# Patient Record
Sex: Female | Born: 1935 | Race: White | Hispanic: No | Marital: Married | Smoking: Former smoker
Health system: Southern US, Community
[De-identification: ages and names within clinical notes are randomized; demographics above are authoritative.]

## PROBLEM LIST (undated history)

## (undated) DIAGNOSIS — M545 Low back pain, unspecified: Secondary | ICD-10-CM

## (undated) DIAGNOSIS — I10 Essential (primary) hypertension: Secondary | ICD-10-CM

## (undated) DIAGNOSIS — I4892 Unspecified atrial flutter: Secondary | ICD-10-CM

## (undated) DIAGNOSIS — G4700 Insomnia, unspecified: Secondary | ICD-10-CM

## (undated) HISTORY — PX: APPENDECTOMY: SHX54

## (undated) HISTORY — PX: ABDOMINAL HYSTERECTOMY: SHX81

## (undated) HISTORY — PX: PARTIAL COLECTOMY: SHX5273

---

## 2004-12-18 ENCOUNTER — Ambulatory Visit: Payer: Self-pay | Admitting: Unknown Physician Specialty

## 2005-04-07 ENCOUNTER — Ambulatory Visit: Payer: Self-pay | Admitting: Unknown Physician Specialty

## 2005-04-08 ENCOUNTER — Ambulatory Visit: Payer: Self-pay | Admitting: Unknown Physician Specialty

## 2005-06-30 ENCOUNTER — Ambulatory Visit: Payer: Self-pay | Admitting: Urology

## 2006-04-05 ENCOUNTER — Ambulatory Visit: Payer: Self-pay | Admitting: Unknown Physician Specialty

## 2006-07-07 ENCOUNTER — Ambulatory Visit: Payer: Self-pay | Admitting: Urology

## 2007-07-13 ENCOUNTER — Ambulatory Visit: Payer: Self-pay | Admitting: Unknown Physician Specialty

## 2007-08-08 ENCOUNTER — Ambulatory Visit: Payer: Self-pay | Admitting: Unknown Physician Specialty

## 2007-08-25 ENCOUNTER — Other Ambulatory Visit: Payer: Self-pay

## 2007-08-25 ENCOUNTER — Ambulatory Visit: Payer: Self-pay | Admitting: Surgery

## 2007-08-28 ENCOUNTER — Inpatient Hospital Stay: Payer: Self-pay | Admitting: Surgery

## 2016-11-01 ENCOUNTER — Observation Stay: Payer: Medicare Other

## 2016-11-01 ENCOUNTER — Emergency Department: Payer: Medicare Other

## 2016-11-01 ENCOUNTER — Observation Stay
Admission: EM | Admit: 2016-11-01 | Discharge: 2016-11-01 | Disposition: A | Discharge status: Home / Self Care | Payer: Medicare Other | Attending: Internal Medicine | Admitting: Internal Medicine

## 2016-11-01 DIAGNOSIS — G4700 Insomnia, unspecified: Secondary | ICD-10-CM | POA: Diagnosis not present

## 2016-11-01 DIAGNOSIS — M545 Low back pain: Secondary | ICD-10-CM | POA: Diagnosis not present

## 2016-11-01 DIAGNOSIS — I484 Atypical atrial flutter: Secondary | ICD-10-CM | POA: Insufficient documentation

## 2016-11-01 DIAGNOSIS — Z791 Long term (current) use of non-steroidal anti-inflammatories (NSAID): Secondary | ICD-10-CM | POA: Diagnosis not present

## 2016-11-01 DIAGNOSIS — Z79899 Other long term (current) drug therapy: Secondary | ICD-10-CM | POA: Diagnosis not present

## 2016-11-01 DIAGNOSIS — Z87891 Personal history of nicotine dependence: Secondary | ICD-10-CM | POA: Insufficient documentation

## 2016-11-01 DIAGNOSIS — I4581 Long QT syndrome: Secondary | ICD-10-CM | POA: Insufficient documentation

## 2016-11-01 DIAGNOSIS — Z9049 Acquired absence of other specified parts of digestive tract: Secondary | ICD-10-CM | POA: Insufficient documentation

## 2016-11-01 DIAGNOSIS — R0600 Dyspnea, unspecified: Principal | ICD-10-CM | POA: Diagnosis present

## 2016-11-01 DIAGNOSIS — R531 Weakness: Secondary | ICD-10-CM | POA: Diagnosis not present

## 2016-11-01 DIAGNOSIS — I10 Essential (primary) hypertension: Secondary | ICD-10-CM | POA: Insufficient documentation

## 2016-11-01 DIAGNOSIS — G459 Transient cerebral ischemic attack, unspecified: Secondary | ICD-10-CM | POA: Diagnosis not present

## 2016-11-01 DIAGNOSIS — K5900 Constipation, unspecified: Secondary | ICD-10-CM | POA: Diagnosis not present

## 2016-11-01 DIAGNOSIS — N179 Acute kidney failure, unspecified: Secondary | ICD-10-CM | POA: Insufficient documentation

## 2016-11-01 HISTORY — DX: Low back pain: M54.5

## 2016-11-01 HISTORY — DX: Essential (primary) hypertension: I10

## 2016-11-01 HISTORY — DX: Low back pain, unspecified: M54.50

## 2016-11-01 HISTORY — DX: Insomnia, unspecified: G47.00

## 2016-11-01 HISTORY — DX: Unspecified atrial flutter: I48.92

## 2016-11-01 LAB — CBC
HEMATOCRIT: 44.4 % (ref 35.0–47.0)
Hemoglobin: 15.4 g/dL (ref 12.0–16.0)
MCH: 30.7 pg (ref 26.0–34.0)
MCHC: 34.7 g/dL (ref 32.0–36.0)
MCV: 88.4 fL (ref 80.0–100.0)
Platelets: 194 10*3/uL (ref 150–440)
RBC: 5.02 MIL/uL (ref 3.80–5.20)
RDW: 12.9 % (ref 11.5–14.5)
WBC: 8 10*3/uL (ref 3.6–11.0)

## 2016-11-01 LAB — BASIC METABOLIC PANEL
Anion gap: 11 (ref 5–15)
BUN: 23 mg/dL — AB (ref 6–20)
CHLORIDE: 102 mmol/L (ref 101–111)
CO2: 24 mmol/L (ref 22–32)
Calcium: 9.8 mg/dL (ref 8.9–10.3)
Creatinine, Ser: 1.25 mg/dL — ABNORMAL HIGH (ref 0.44–1.00)
GFR calc non Af Amer: 37 mL/min — ABNORMAL LOW (ref >60)
GFR, EST AFRICAN AMERICAN: 43 mL/min — AB (ref >60)
Glucose, Bld: 119 mg/dL — ABNORMAL HIGH (ref 65–99)
POTASSIUM: 3.6 mmol/L (ref 3.5–5.1)
SODIUM: 137 mmol/L (ref 135–145)

## 2016-11-01 LAB — TROPONIN I
Troponin I: <0.03 ng/mL (ref <0.03)
Troponin I: <0.03 ng/mL (ref <0.03)
Troponin I: <0.03 ng/mL (ref <0.03)
Troponin I: <0.03 ng/mL (ref <0.03)

## 2016-11-01 LAB — HEMOGLOBIN A1C
Hgb A1c MFr Bld: 5.5 % (ref 4.8–5.6)
Mean Plasma Glucose: 111.15 mg/dL

## 2016-11-01 LAB — TSH: TSH: 1.997 u[IU]/mL (ref 0.350–4.500)

## 2016-11-01 MED ORDER — AMIODARONE HCL 200 MG PO TABS: 200.0000 mg | ORAL_TABLET | Freq: Two times a day (BID) | ORAL | 2 refills | Status: DC

## 2016-11-01 MED ORDER — CARVEDILOL 6.25 MG PO TABS: 6.2500 mg | ORAL_TABLET | Freq: Two times a day (BID) | ORAL | Status: DC

## 2016-11-01 MED ORDER — ASPIRIN EC 81 MG PO TBEC
81.0000 mg | DELAYED_RELEASE_TABLET | Freq: Every day | ORAL | Status: DC
Start: 2016-11-01 — End: 2016-11-01

## 2016-11-01 MED ORDER — CARVEDILOL 3.125 MG PO TABS
3.1250 mg | ORAL_TABLET | Freq: Two times a day (BID) | ORAL | Status: DC
  Administered 2016-11-01: 3.125 mg via ORAL
  Filled 2016-11-01: qty 1

## 2016-11-01 MED ORDER — ACETAMINOPHEN 650 MG RE SUPP: 650.0000 mg | Freq: Four times a day (QID) | RECTAL | Status: DC | PRN

## 2016-11-01 MED ORDER — DOCUSATE SODIUM 100 MG PO CAPS
100.0000 mg | ORAL_CAPSULE | Freq: Two times a day (BID) | ORAL | Status: DC
  Administered 2016-11-01: 100 mg via ORAL
  Filled 2016-11-01: qty 1

## 2016-11-01 MED ORDER — ZOLPIDEM TARTRATE 5 MG PO TABS: 5.0000 mg | ORAL_TABLET | Freq: Every day | ORAL | Status: DC

## 2016-11-01 MED ORDER — STROKE: EARLY STAGES OF RECOVERY BOOK: Freq: Once | Status: DC

## 2016-11-01 MED ORDER — ONDANSETRON HCL 4 MG/2ML IJ SOLN: 4.0000 mg | Freq: Four times a day (QID) | INTRAMUSCULAR | Status: DC | PRN

## 2016-11-01 MED ORDER — ACETAMINOPHEN 325 MG PO TABS: 650.0000 mg | ORAL_TABLET | Freq: Four times a day (QID) | ORAL | Status: DC | PRN

## 2016-11-01 MED ORDER — AMIODARONE HCL 200 MG PO TABS
400.0000 mg | ORAL_TABLET | Freq: Two times a day (BID) | ORAL | Status: DC
  Administered 2016-11-01: 400 mg via ORAL
  Filled 2016-11-01: qty 2

## 2016-11-01 MED ORDER — LORAZEPAM 1 MG PO TABS
1.0000 mg | ORAL_TABLET | Freq: Once | ORAL | Status: AC
  Administered 2016-11-01: 1 mg via ORAL
  Filled 2016-11-01: qty 1

## 2016-11-01 MED ORDER — POLYETHYLENE GLYCOL 3350 17 G PO PACK
17.0000 g | PACK | Freq: Every day | ORAL | Status: DC
  Administered 2016-11-01: 17 g via ORAL
  Filled 2016-11-01: qty 1

## 2016-11-01 MED ORDER — PANTOPRAZOLE SODIUM 40 MG PO TBEC
40.0000 mg | DELAYED_RELEASE_TABLET | Freq: Every day | ORAL | Status: DC
  Administered 2016-11-01: 40 mg via ORAL
  Filled 2016-11-01: qty 1

## 2016-11-01 MED ORDER — DOCUSATE SODIUM 100 MG PO CAPS: 100.0000 mg | ORAL_CAPSULE | Freq: Two times a day (BID) | ORAL | 0 refills | Status: DC

## 2016-11-01 MED ORDER — CARVEDILOL 6.25 MG PO TABS: 6.2500 mg | ORAL_TABLET | Freq: Two times a day (BID) | ORAL | 2 refills | Status: DC

## 2016-11-01 MED ORDER — ASPIRIN EC 81 MG PO TBEC
81.0000 mg | DELAYED_RELEASE_TABLET | Freq: Every day | ORAL | Status: DC
  Filled 2016-11-01: qty 1

## 2016-11-01 MED ORDER — HEPARIN SODIUM (PORCINE) 5000 UNIT/ML IJ SOLN: 5000.0000 [IU] | Freq: Three times a day (TID) | INTRAMUSCULAR | Status: DC

## 2016-11-01 MED ORDER — IOPAMIDOL (ISOVUE-370) INJECTION 76%
60.0000 mL | Freq: Once | INTRAVENOUS | Status: AC | PRN
  Administered 2016-11-01: 60 mL via INTRAVENOUS

## 2016-11-01 MED ORDER — PANTOPRAZOLE SODIUM 40 MG PO TBEC: 40.0000 mg | DELAYED_RELEASE_TABLET | Freq: Every day | ORAL | 2 refills | Status: DC

## 2016-11-01 MED ORDER — APIXABAN 5 MG PO TABS
5.0000 mg | ORAL_TABLET | Freq: Two times a day (BID) | ORAL | Status: DC
  Administered 2016-11-01: 5 mg via ORAL
  Filled 2016-11-01: qty 1

## 2016-11-01 MED ORDER — ONDANSETRON HCL 4 MG PO TABS: 4.0000 mg | ORAL_TABLET | Freq: Four times a day (QID) | ORAL | Status: DC | PRN

## 2016-11-01 MED ORDER — TRAMADOL HCL 50 MG PO TABS: 25.0000 mg | ORAL_TABLET | Freq: Every day | ORAL | Status: DC

## 2016-11-01 MED ORDER — BISACODYL 5 MG PO TBEC
10.0000 mg | DELAYED_RELEASE_TABLET | Freq: Once | ORAL | Status: AC
  Administered 2016-11-01: 10 mg via ORAL
  Filled 2016-11-01: qty 2

## 2016-11-01 MED ORDER — AMIODARONE HCL 200 MG PO TABS: 200.0000 mg | ORAL_TABLET | Freq: Two times a day (BID) | ORAL | Status: DC

## 2016-11-01 MED ORDER — INFLUENZA VAC SPLIT HIGH-DOSE 0.5 ML IM SUSY: 0.5000 mL | PREFILLED_SYRINGE | INTRAMUSCULAR | Status: DC

## 2016-11-01 NOTE — Evaluation (Signed)
Physical Therapy Evaluation Patient Details Name: Lynn Hernandez MRN: [id number] DOB: Mar 13, 1926 Today's Date: 11/01/2016   History of Present Illness  Pt is an 83 y.o. female presenting to hospital with L UE weakness and chest discomfort.  Pt with recent hospital admission at [facility] for palpitations and SOB (dx a-fib and started on Eliquis).  Pt admitted for TIA, arrhythmia, and AKI.  CT of head and MRI negative for acute abnormality.  PMH includes a-fib, htn, partial colectomy.  Clinical Impression  Pt was WNL for ROM, MMT and sensation on UE and LE bilaterally. She was able to perform all bed mobility, transfer and balance tasks independently; pt was able to ambulate >220ft and climb at least 6 steps with CGA provided for safety, but not required for session. Pt's HR remained in the low 90's (bpm) for seated tasks and gait; increased to 110 bpm after walking up and down stairs. Pt did not report any complaints of SOB throughout session. No further acute PT needs identified. Will complete current PT order and discharge pt from PT in house.    Follow Up Recommendations No PT follow up    Equipment Recommendations  None recommended by PT    Recommendations for Other Services       Precautions / Restrictions Precautions Precautions: None Restrictions Weight Bearing Restrictions: No      Mobility  Bed Mobility Overal bed mobility: Independent             General bed mobility comments: Steady, no vc's required  Transfers Overall transfer level: Independent Equipment used: None             General transfer comment: Safely performed, no vc's required  Ambulation/Gait Ambulation/Gait assistance: Independent (CGA provided for safety, but not required for session) Ambulation Distance (Feet): 235 Feet Assistive device: None Gait Pattern/deviations: WFL(Within Functional Limits) Gait velocity: 5.2 ft/sec Gait velocity interpretation: at or above normal speed for  age/gender General Gait Details: Steady, reciprocal gait pattern, no loss of balance, slight toe out bilaterally  Stairs Stairs: Yes Stairs assistance: Independent (CGA provided for safety but not required for session) Stair Management: One rail Right;One rail Left;Alternating pattern (Rail on right going up, rail on left going down) Number of Stairs: 6    Wheelchair Mobility    Modified Rankin (Stroke Patients Only)       Balance Overall balance assessment: Independent (Able to reach outside of BOS while standing without loss of balance)                                           Pertinent Vitals/Pain Pain Assessment: No/denies pain    Home Living Family/patient expects to be discharged to:: Private residence Living Arrangements: Alone Available Help at Discharge: Family Type of Home: House Home Access: Stairs to enter Entrance Stairs-Rails: None (pulls on door frame/handle) Entrance Stairs-Number of Steps: 1 Home Layout: Multi-level (Two levels, but stays on first floor) Home Equipment: Grab bars - toilet;Grab bars - tub/shower      Prior Function Level of Independence: Independent         Comments: Pt is independent with all ADLs including driving on her tractor to mow the lawn and driving her car to run errands     Hand Dominance   Dominant Hand: Right    Extremity/Trunk Assessment   Upper Extremity Assessment Upper Extremity Assessment: Overall  WFL for tasks assessed    Lower Extremity Assessment Lower Extremity Assessment: Overall WFL for tasks assessed    Cervical / Trunk Assessment Cervical / Trunk Assessment: Normal  Communication   Communication: HOH (Bilateral hearing aids)  Cognition Arousal/Alertness: Awake/alert Behavior During Therapy: WFL for tasks assessed/performed Overall Cognitive Status: Within Functional Limits for tasks assessed                                        General Comments  Pt  agreed to PT.    Exercises     Assessment/Plan    PT Assessment Patent does not need any further PT services  PT Problem List         PT Treatment Interventions      PT Goals (Current goals can be found in the Care Plan section)  Acute Rehab PT Goals Patient Stated Goal: to be able to go home PT Goal Formulation: With patient Time For Goal Achievement: 11/15/16 Potential to Achieve Goals: Good    Frequency     Barriers to discharge        Co-evaluation               AM-PAC PT "6 Clicks" Daily Activity  Outcome Measure Difficulty turning over in bed (including adjusting bedclothes, sheets and blankets)?: None Difficulty moving from lying on back to sitting on the side of the bed? : None Difficulty sitting down on and standing up from a chair with arms (e.g., wheelchair, bedside commode, etc,.)?: None Help needed moving to and from a bed to chair (including a wheelchair)?: None Help needed walking in hospital room?: None Help needed climbing 3-5 steps with a railing? : None 6 Click Score: 24    End of Session Equipment Utilized During Treatment: Gait belt Activity Tolerance: Patient tolerated treatment well Patient left: in chair;with call bell/phone within reach (Nursing cleared pt not to have chair alarm and to leave SCD's off.) Nurse Communication: Mobility status (Pt's HR during session) PT Visit Diagnosis: Muscle weakness (generalized) (M62.81)    Time: 1610-9604 PT Time Calculation (min) (ACUTE ONLY): 32 min   Charges:         PT G CodesSarina Ser, SPT 11/01/2016, 4:23 PM

## 2016-11-01 NOTE — Care Management (Signed)
No discharge needs identified by members of the care team.  Eliquis and amiodarone are not new medications for this admission.

## 2016-11-01 NOTE — Progress Notes (Signed)
SLP Cancellation Note  Patient Details Name: Lynn Hernandez MRN: [id number] DOB: 03/16/1926   Cancelled treatment:       Reason Eval/Treat Not Completed: SLP screened, no needs identified, will sign off (chart reviewed; consulted NSG then met w/ pt/family) Pt denied any difficulty swallowing and is currently on a regular diet; she stated she did have trouble swallowing "chicken and large pills" in the past 3-4 days during her admission to the hospital in [place]. Briefly discussed general s/s of reflux and foods that can be difficult to swallow; strategies to aid including using chewable vitamins(which she now has she stated). Encouraged pt to f/u w/ her primary MD re: her s/s and possible f/u w/ GI to r/o reflux issues. She has been swallowing pills w/ applesauce per NSG. Pt conversed at conversational level w/out deficits noted; pt and family denied any speech-language deficits.  No further skilled ST services indicated as pt appears at her baseline. Pt agreed. NSG to reconsult if any change in status.    Katherine Watson, MS, CCC-SLP Watson,Katherine 11/01/2016, 3:25 PM   

## 2016-11-01 NOTE — Consult Note (Signed)
Cardiology Consult    Patient ID: Lynn Hernandez MRN: [id number], DOB/AGE: 13-Feb-1926   Admit date: 11/01/2016 Date of Consult: 11/01/2016  Primary Physician: Dortha Kern, MD Primary Cardiologist: new - M. Kirke Corin, MD  Requesting Provider: Henreitta Leber, MD  Patient Profile    Lynn Hernandez is a 83 y.o. female with a history of insomnia and low back pain, who is being seen today for the evaluation of Atrial flutter at the request of Dr. Nemiah Commander.  Past Medical History   Past Medical History:  Diagnosis Date  . Atrial flutter (HCC)    a. Dx 10/29/2016 - Despina Hick [facility], [place]-->Placed on Eliquis 5 bid and amio.  [name] HTN (hypertension)   . Insomnia   . Low back pain     Past Surgical History:  Procedure Laterality Date  . ABDOMINAL HYSTERECTOMY    . APPENDECTOMY    . PARTIAL COLECTOMY       Allergies  No Known Allergies  History of Present Illness    83 year old female without prior cardiac history. She was in her usual state of health until the afternoon of September 21, when while staying with family at [facility], she had sudden onset of tachycardia palpitations associated with dyspnea and mild chest discomfort. This began at 2 PM. She was [facility] and was diagnosed with atrial flutter. An echocardiogram was reportedly performed, however she is not aware of the results we do not currently have access to the results. She was placed on amiodarone and eliquis and discharged home on September 23 with recommendation to follow-up with cardiology.  In the early morning hours of September she returned home at approximate 5 PM yesterday, September 23, and felt well throughout the evening. She went to bed and at some point awoke suddenly with a cold sensation in her neck that spread up her face and head. She noted dryness of her mouth and difficulty swallowing. She got up and walked around some thinking of this would improve. She then developed left arm  and leg heaviness. She called EMS and was taken to [facility]. In route, she noted resolution of all symptoms. She thinks that symptoms lasted about 30-40 minutes in total. Upon arrival to the emergency department, she was found to be in atrial flutter at a rate of 98. Head CT was nonacute. CT Marylene Land of the chest was also performed and did not show any evidence of PE. Bilateral upper lobe nodules were noted. She was admitted for further evaluation is currently waiting MRI/MRA. We have been asked to evaluate in the setting of atrial flutter. She is currently asymptomatic from that standpoint.  Inpatient Medications    .  stroke: mapping our early stages of recovery book   Does not apply Once  . amiodarone  400 mg Oral BID  . apixaban  5 mg Oral BID  . aspirin EC  81 mg Oral Daily  . carvedilol  3.125 mg Oral BID  . docusate sodium  100 mg Oral BID  . [START ON 11/02/2016] Influenza vac split quadrivalent PF  0.5 mL Intramuscular Tomorrow-1000  . traMADol  25 mg Oral QHS  . zolpidem  5 mg Oral QHS    Family History    Family History  Problem Relation Age of Onset  . Healthy Mother        Died of old age @ 59.  [name] Healthy Maternal Grandmother   . Prostate cancer Father   .  Prostate cancer Brother   . Healthy Brother     Social History    Social History   Social History  . Marital status: Married    Spouse name: N/A  . Number of children: N/A  . Years of education: N/A   Occupational History  . 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 Smoking status: Former Games developer  . Smokeless tobacco: Never Used     Comment: smoked 1 pack/wk for about 10 years in her 50's to 51's.  . Alcohol use No  . Drug use: No  . Sexual activity: Not on file   Other Topics Concern  . Not on file   Social History Narrative   Lives in [place].  Active.  Does not routinely exercise.     Review of Systems    General:  No chills, fever, night sweats or weight  changes.  Cardiovascular:  +++ chest pain in setting of rapid aflutter 9/21 - assoc w/ dyspnea.  No edema, orthopnea, palpitations, paroxysmal nocturnal dyspnea. Dermatological: No rash, lesions/masses Respiratory: No cough, dyspnea Urologic: No hematuria, dysuria Abdominal:   No nausea, vomiting, diarrhea, bright red blood per rectum, melena, or hematemesis Neurologic:  Left arm/leg heaviness yesterday. Cold sensation of neck/face - resolved.  No visual changes, wkns, changes in mental status. All other systems reviewed and are otherwise negative except as noted above.  Physical Exam    Blood pressure 139/81, pulse 98, temperature 98.2 F (36.8 C), temperature source Oral, resp. rate 18, height  (1.626 m), weight 157 lb 6.4 oz (71.4 kg), SpO2 97 %.  General: Pleasant, NAD Psych: Normal affect. Neuro: Alert and oriented X 3. Moves all extremities spontaneously. HEENT: Normal  Neck: Supple without bruits or JVD. Lungs:  Resp regular and unlabored, CTA. Heart: IR, IR no s3, s4, or murmurs. Abdomen: Soft, non-tender, non-distended, BS + x 4.  Extremities: No clubbing, cyanosis or edema. DP/PT/Radials 2+ and equal bilaterally.  Labs     Recent Labs  11/01/16 0105 11/01/16 0502 11/01/16 0945  TROPONINI <0.03 <0.03 <0.03   Lab Results  Component Value Date   WBC 8.0 11/01/2016   HGB 15.4 11/01/2016   HCT 44.4 11/01/2016   MCV 88.4 11/01/2016   PLT 194 11/01/2016     Recent Labs Lab 11/01/16 0105  NA 137  K 3.6  CL 102  CO2 24  BUN 23*  CREATININE 1.25*  CALCIUM 9.8  GLUCOSE 119*     Radiology Studies    Ct Head Wo Contrast  Result Date: 11/01/2016 CLINICAL DATA:  Weakness EXAM: CT HEAD WITHOUT CONTRAST TECHNIQUE: Contiguous axial images were obtained from the base of the skull through the vertex without intravenous contrast. COMPARISON:  None. FINDINGS: Brain: No mass lesion, intraparenchymal hemorrhage or extra-axial collection. No evidence of acute cortical  infarct. There is periventricular hypoattenuation compatible with chronic microvascular disease. Vascular: No hyperdense vessel or unexpected calcification. Skull: Normal visualized skull base, calvarium and extracranial soft tissues. Sinuses/Orbits: No sinus fluid levels or advanced mucosal thickening. No mastoid effusion. Normal orbits. IMPRESSION: Mild chronic microvascular ischemia for age.  No acute abnormality. Electronically Signed   By: Deatra Robinson M.D.   On: 11/01/2016 02:37   Ct Angio Chest Pe W And/or Wo Contrast  Result Date: 11/01/2016 CLINICAL DATA:  Chest pain and weakness EXAM: CT ANGIOGRAPHY CHEST WITH CONTRAST TECHNIQUE: Multidetector CT imaging of the chest was performed using the standard protocol during  bolus administration of intravenous contrast. Multiplanar CT image reconstructions and MIPs were obtained to evaluate the vascular anatomy. CONTRAST:  60 mL Isovue 370 COMPARISON:  Chest radiograph 11/01/2016 FINDINGS: Cardiovascular: Contrast injection is sufficient to demonstrate satisfactory opacification of the pulmonary arteries to the segmental level. There is no pulmonary embolus. The main pulmonary artery is within normal limits for size. There is no CT evidence of acute right heart strain. There is calcific aortic atherosclerosis. There is a normal 3-vessel arch branching pattern. Heart size is mildly enlarged. No pericardial effusion. Mediastinum/Nodes: No mediastinal, hilar or axillary lymphadenopathy. The visualized thyroid and thoracic esophageal course are unremarkable. Lungs/Pleura: 4 mm nodule in the right upper lobe. No pleural effusion or pneumothorax. Bibasilar atelectasis. No focal consolidation. There are multiple perifissural nodules of the left lung measuring up to 3 mm. Focal area of atelectasis in the left lower lobe. Upper Abdomen: Contrast bolus timing is not optimized for evaluation of the abdominal organs. Within this limitation, the visualized organs of the  upper abdomen are normal. Musculoskeletal: No chest wall abnormality. No acute or significant osseous findings. Review of the MIP images confirms the above findings. IMPRESSION: 1. No pulmonary embolus or other acute thoracic abnormality. 2. 4 mm right upper lobe nodule. 3 mm left upper lobe nodules. No follow-up needed if patient is low-risk (and has no known or suspected primary neoplasm). Non-contrast chest CT can be considered in 12 months if patient is high-risk. This recommendation follows the consensus statement: Guidelines for Management of Incidental Pulmonary Nodules Detected on CT Images: From the Fleischner Society 2017; Radiology 2017; 284:228-243. 3. Mild cardiomegaly and Aortic Atherosclerosis (ICD10-I70.0). Electronically Signed   By: Deatra Robinson M.D.   On: 11/01/2016 02:26   Dg Chest Port 1 View  Result Date: 11/01/2016 CLINICAL DATA:  Left-sided chest pain and weakness.  Dyspnea. EXAM: PORTABLE CHEST 1 VIEW COMPARISON:  None. FINDINGS: The heart size and mediastinal contours are within normal limits. Aortic atherosclerosis is noted. Mitral annular calcifications are seen. Mild upper lobe emphysematous hyperinflation. No pneumonic consolidation, pneumothorax, pulmonary edema nor effusion. Osteoarthritis of the [facility] and glenohumeral joints. Soft tissue calcifications are noted along the right rotator cuff. IMPRESSION: 1. Upper lobe predominant emphysematous hyperinflation of the lungs. 2. Aortic atherosclerosis. 3. Right rotator cuff calcific tendinopathy with AC glenohumeral joint osteoarthritis bilaterally. Electronically Signed   By: Tollie Eth M.D.   On: 11/01/2016 01:28    ECG & Cardiac Imaging    Atrial flutter, 98, left axis deviation, question anteroseptal infarct, mild lateral ST depression  Assessment & Plan    1. Atrial flutter: ECG and telemetry reviewed. Patient appears to have atypical atrial flutter with variable conduction. She was admitted to [facility] in  [place] on Friday, September 21 for palpitations and this was diagnosed at that time. She is currently on amiodarone and eliquis. She was admitted to [facility] overnight with stroke/TIA like symptoms. These resolved after 30-40 minutes. She has been rate controlled atrial flutter since admission. Continue amiodarone, carvedilol, and eliquis. CHA2DS2VASc equals 6, assuming current presentation represents TIA. We will plan to follow her as an outpatient and arrange for outpatient cardioversion at some point in the future.  2. TIA: 30-40 minutes of facial coldness with dysphasia and left sided heaviness. CT negative. MRI/A pending. Echocardiogram pending. Patient says she has been on eliquis since Saturday after presentation with atrial flutter on Friday. If not necessary, would recommend discontinuation of aspirin in the setting of ongoing eliquis therapy.  3. Essential hypertension: Stable on beta blocker.  Signed, Nicolasa Ducking, NP 11/01/2016, 12:11 PM  For questions or updates, please contact   Please consult [email or web address] for contact info under Cardiology/STEMI.

## 2016-11-01 NOTE — ED Triage Notes (Signed)
Pt arrived via ems for c/o left sided chest pain and weakness - pt was in [facility] for 2 days and released this am - pt was started on new cardiac medications and told to follow up with cardiologist here for cardiac cath

## 2016-11-01 NOTE — Progress Notes (Signed)
OT Cancellation Note  Patient Details Name: Lynn Hernandez MRN: [id number] DOB: 1926-02-15   Cancelled Treatment:    Reason Eval/Treat Not Completed: Other (comment). Order received. Per nursing and PT, pt received Ativan in preparation for MRI and recommending hold therapy until later time. Will re-attempt OT evaluation at later date/time as pt is available.  Richrd Prime, MPH, MS, OTR/L ascom [phone] 11/01/16, 1:11 PM

## 2016-11-01 NOTE — Care Management Obs Status (Signed)
MEDICARE OBSERVATION STATUS NOTIFICATION   Patient Details  Name: Lynn Hernandez MRN: [id number] Date of Birth: [birth year]   Medicare Observation Status Notification Given:  No <24hours  Eber Hong, RN 11/01/2016, 5:42 PM

## 2016-11-01 NOTE — Progress Notes (Signed)
[employer] - [place] [facility]   PATIENT NAME: Lynn Hernandez    MR#:  [id number]  DATE OF BIRTH:  [birth year]  SUBJECTIVE:  CHIEF COMPLAINT:   Chief Complaint  Patient presents with  . Weakness  . Chest Pain   - complains of facial flushing occasionally since yesterday  REVIEW OF SYSTEMS:  Review of Systems  Constitutional: Negative for chills, fever and malaise/fatigue.  HENT: Negative for congestion, ear discharge, hearing loss and nosebleeds.   Eyes: Negative for blurred vision and double vision.  Respiratory: Negative for cough, shortness of breath and wheezing.   Cardiovascular: Negative for chest pain, palpitations and leg swelling.  Gastrointestinal: Negative for abdominal pain, constipation, diarrhea, nausea and vomiting.  Genitourinary: Negative for dysuria.  Musculoskeletal: Negative for back pain and myalgias.  Neurological: Negative for dizziness, seizures and headaches.       Facial flushing    DRUG ALLERGIES:  No Known Allergies  VITALS:  Blood pressure 139/81, pulse 98, temperature 98.2 F (36.8 C), temperature source Oral, resp. rate 18, height  (1.626 m), weight 71.4 kg (157 lb 6.4 oz), SpO2 97 %.  PHYSICAL EXAMINATION:  Physical Exam  GENERAL:  83 y.o.-year-old patient lying in the bed with no acute distress.  EYES: Pupils equal, round, reactive to light and accommodation. No scleral icterus. Extraocular muscles intact.  HEENT: Head atraumatic, normocephalic. Oropharynx and nasopharynx clear.  NECK:  Supple, no jugular venous distention. No thyroid enlargement, no tenderness.  LUNGS: Normal breath sounds bilaterally, no wheezing, rales,rhonchi or crepitation. No use of accessory muscles of respiration.  CARDIOVASCULAR: S1, S2 normal. No murmurs, rubs, or gallops.  ABDOMEN: Soft, nontender, nondistended. Bowel sounds present. No organomegaly or mass.  EXTREMITIES: No pedal edema, cyanosis, or clubbing.  NEUROLOGIC: Cranial  nerves II through XII are intact. Muscle strength 5/5 in all extremities. Sensation intact. Gait not checked.  PSYCHIATRIC: The patient is alert and oriented x 3.  SKIN: No obvious rash, lesion, or ulcer.    LABORATORY PANEL:   CBC  Recent Labs Lab 11/01/16 0105  WBC 8.0  HGB 15.4  HCT 44.4  PLT 194   ------------------------------------------------------------------------------------------------------------------  Chemistries   Recent Labs Lab 11/01/16 0105  NA 137  K 3.6  CL 102  CO2 24  GLUCOSE 119*  BUN 23*  CREATININE 1.25*  CALCIUM 9.8   ------------------------------------------------------------------------------------------------------------------  Cardiac Enzymes  Recent Labs Lab 11/01/16 0945  TROPONINI <0.03   ------------------------------------------------------------------------------------------------------------------  RADIOLOGY:  Ct Head Wo Contrast  Result Date: 11/01/2016 CLINICAL DATA:  Weakness EXAM: CT HEAD WITHOUT CONTRAST TECHNIQUE: Contiguous axial images were obtained from the base of the skull through the vertex without intravenous contrast. COMPARISON:  None. FINDINGS: Brain: No mass lesion, intraparenchymal hemorrhage or extra-axial collection. No evidence of acute cortical infarct. There is periventricular hypoattenuation compatible with chronic microvascular disease. Vascular: No hyperdense vessel or unexpected calcification. Skull: Normal visualized skull base, calvarium and extracranial soft tissues. Sinuses/Orbits: No sinus fluid levels or advanced mucosal thickening. No mastoid effusion. Normal orbits. IMPRESSION: Mild chronic microvascular ischemia for age.  No acute abnormality. Electronically Signed   By: Deatra Robinson M.D.   On: 11/01/2016 02:37   Ct Angio Chest Pe W And/or Wo Contrast  Result Date: 11/01/2016 CLINICAL DATA:  Chest pain and weakness EXAM: CT ANGIOGRAPHY CHEST WITH CONTRAST TECHNIQUE: Multidetector CT imaging  of the chest was performed using the standard protocol during bolus administration of intravenous contrast. Multiplanar CT image reconstructions and MIPs were obtained  to evaluate the vascular anatomy. CONTRAST:  60 mL Isovue 370 COMPARISON:  Chest radiograph 11/01/2016 FINDINGS: Cardiovascular: Contrast injection is sufficient to demonstrate satisfactory opacification of the pulmonary arteries to the segmental level. There is no pulmonary embolus. The main pulmonary artery is within normal limits for size. There is no CT evidence of acute right heart strain. There is calcific aortic atherosclerosis. There is a normal 3-vessel arch branching pattern. Heart size is mildly enlarged. No pericardial effusion. Mediastinum/Nodes: No mediastinal, hilar or axillary lymphadenopathy. The visualized thyroid and thoracic esophageal course are unremarkable. Lungs/Pleura: 4 mm nodule in the right upper lobe. No pleural effusion or pneumothorax. Bibasilar atelectasis. No focal consolidation. There are multiple perifissural nodules of the left lung measuring up to 3 mm. Focal area of atelectasis in the left lower lobe. Upper Abdomen: Contrast bolus timing is not optimized for evaluation of the abdominal organs. Within this limitation, the visualized organs of the upper abdomen are normal. Musculoskeletal: No chest wall abnormality. No acute or significant osseous findings. Review of the MIP images confirms the above findings. IMPRESSION: 1. No pulmonary embolus or other acute thoracic abnormality. 2. 4 mm right upper lobe nodule. 3 mm left upper lobe nodules. No follow-up needed if patient is low-risk (and has no known or suspected primary neoplasm). Non-contrast chest CT can be considered in 12 months if patient is high-risk. This recommendation follows the consensus statement: Guidelines for Management of Incidental Pulmonary Nodules Detected on CT Images: From the Fleischner Society 2017; Radiology 2017; 284:228-243. 3. Mild  cardiomegaly and Aortic Atherosclerosis (ICD10-I70.0). Electronically Signed   By: Deatra Robinson M.D.   On: 11/01/2016 02:26   Mr Brain Wo Contrast  Result Date: 11/01/2016 CLINICAL DATA:  Left upper extremity weakness today. Recent diagnosis of atrial fibrillation. EXAM: MRI HEAD WITHOUT CONTRAST MRA HEAD WITHOUT CONTRAST TECHNIQUE: Multiplanar, multiecho pulse sequences of the brain and surrounding structures were obtained without intravenous contrast. Angiographic images of the head were obtained using MRA technique without contrast. COMPARISON:  Head CT from earlier today FINDINGS: MRI HEAD FINDINGS Brain: No acute infarction, hemorrhage, hydrocephalus, extra-axial collection or mass lesion. Mild for age cerebral volume loss and chronic periventricular microvascular ischemic change. Vascular: Major vessels are patent. Skull and upper cervical spine: Negative for marrow lesion. Asymmetric left facet arthropathy at C2-3 with mild anterolisthesis. C3-4 facet ankylosis on the right. Sinuses/Orbits: Bilateral cataract resection.  No acute finding. MRA HEAD FINDINGS Symmetric carotid arteries and branching. No stenosis, beading, branch occlusion, or aneurysm. IMPRESSION: No acute finding or explanation for symptoms. Unremarkable brain MRI and MRA for age. Electronically Signed   By: Marnee Spring M.D.   On: 11/01/2016 13:14   Dg Chest Port 1 View  Result Date: 11/01/2016 CLINICAL DATA:  Left-sided chest pain and weakness.  Dyspnea. EXAM: PORTABLE CHEST 1 VIEW COMPARISON:  None. FINDINGS: The heart size and mediastinal contours are within normal limits. Aortic atherosclerosis is noted. Mitral annular calcifications are seen. Mild upper lobe emphysematous hyperinflation. No pneumonic consolidation, pneumothorax, pulmonary edema nor effusion. Osteoarthritis of the [facility] and glenohumeral joints. Soft tissue calcifications are noted along the right rotator cuff. IMPRESSION: 1. Upper lobe predominant emphysematous  hyperinflation of the lungs. 2. Aortic atherosclerosis. 3. Right rotator cuff calcific tendinopathy with AC glenohumeral joint osteoarthritis bilaterally. Electronically Signed   By: Tollie Eth M.D.   On: 11/01/2016 01:28   Mr Maxine Glenn Head Wo Contrast  Result Date: 11/01/2016 CLINICAL DATA:  Left upper extremity weakness today. Recent diagnosis of atrial fibrillation.  EXAM: MRI HEAD WITHOUT CONTRAST MRA HEAD WITHOUT CONTRAST TECHNIQUE: Multiplanar, multiecho pulse sequences of the brain and surrounding structures were obtained without intravenous contrast. Angiographic images of the head were obtained using MRA technique without contrast. COMPARISON:  Head CT from earlier today FINDINGS: MRI HEAD FINDINGS Brain: No acute infarction, hemorrhage, hydrocephalus, extra-axial collection or mass lesion. Mild for age cerebral volume loss and chronic periventricular microvascular ischemic change. Vascular: Major vessels are patent. Skull and upper cervical spine: Negative for marrow lesion. Asymmetric left facet arthropathy at C2-3 with mild anterolisthesis. C3-4 facet ankylosis on the right. Sinuses/Orbits: Bilateral cataract resection.  No acute finding. MRA HEAD FINDINGS Symmetric carotid arteries and branching. No stenosis, beading, branch occlusion, or aneurysm. IMPRESSION: No acute finding or explanation for symptoms. Unremarkable brain MRI and MRA for age. Electronically Signed   By: Marnee Spring M.D.   On: 11/01/2016 13:14    EKG:   Orders placed or performed during the hospital encounter of 11/01/16  . ED EKG  . ED EKG  . EKG 12-Lead  . EKG 12-Lead  . EKG 12-Lead  . EKG 12-Lead    ASSESSMENT AND PLAN:   83 year old female with past medical history significant for low back pain and hypertension and new diagnosis of atrial flutter and was started on eliquis last week presents to the hospital secondary to facial flushing.  #1 facial flushing and tingling numbness-still feels secondary to  tachycardia, seems to be in sinus rhythm now -Has atypical flutter with variable conduction on telemetry.  Echo was done at the outside hospital-results not available at this time. -Stroke workup is negative. MRI, MRA is negative. Carotid Dopplers are pending. -Patient already on eliquis will continue that.  #2 atrial flutter-appreciate cardiology consult. New onset atrial flutter diagnosed last week. -We'll decrease the amiodarone at the time of discharge. Increase carvedilol. Discontinue aspirin. -On eliquis for anticoagulation. Likely cardioversion as an outpatient.  #3 hypertension-on Coreg  #4 DVT prophylaxis-on eliquis  #5 Constipation- meds added     All the records are reviewed and case discussed with Care Management/Social Workerr. Management plans discussed with the patient, family and they are in agreement.  CODE STATUS: Full code  TOTAL TIME TAKING CARE OF THIS PATIENT: 37 minutes.   POSSIBLE D/C today or tomorrow , DEPENDING ON CLINICAL CONDITION.   [name] M.D on 11/01/2016 at 2:14 PM  Between 7am to 6pm - Pager - [phone]  After 6pm go to [email or web address] - password [email or web address]  [facility] [place] [facility]  Office  [phone]  CC: Primary care physician; Dortha Kern, MD

## 2016-11-01 NOTE — Progress Notes (Signed)
PT Cancellation Note  Patient Details Name: Lynn Hernandez MRN: [id number] DOB: Feb 13, 1926   Cancelled Treatment:    Reason Eval/Treat Not Completed: Other (comment) Upon entering room, nursing reporting just giving pt Ativan for MRI and recommending holding PT until later time. PT evaluation will be re-attempted at a later time/date.   Sarina Ser, SPT 11/01/2016, 11:41 AM

## 2016-11-01 NOTE — H&P (Signed)
Lynn Hernandez is an 83 y.o. female.   Chief Complaint: Weakness HPI: The patient with newly diagnosed atrial fibrillation and hypertension presents to the emergency department complaining of left upper extremity weakness. The patient was getting ready for bed and was relaxing at the time she felt a wave of cold sensation rise up her neck bilaterally and over her face. She remembers her tongue feeling heavy and her mouth feeling dry like cotton. Her left upper arm felt heavy but she denies grip strength weakness or numbness. The patient denies chest pain at this time but admitted to some vague chest discomfort at some point during this episode. Notably she was admitted to the hospital in [facility] earlier this week for palpitations and shortness of breath at which time she was diagnosed with A. fib and started on Eliquis. CT of the patient's head was negative for ischemic changes. CTA of her chest also rule out pulmonary embolism. Once the patient was stabilized the emergency department staff called the hospitalist service for admission.  Past Medical History:  Diagnosis Date  . A-fib ([place])   . HTN (hypertension)     Past Surgical History:  Procedure Laterality Date  . ABDOMINAL HYSTERECTOMY    . APPENDECTOMY    . PARTIAL COLECTOMY      Family History  Problem Relation Age of Onset  . Healthy Mother   . Healthy Maternal Grandmother    Social History:  reports that she has never smoked. She has never used smokeless tobacco. She reports that she does not drink alcohol or use drugs.  Allergies: No Known Allergies  Medications Prior to Admission  Medication Sig Dispense Refill  . amiodarone (PACERONE) 200 MG tablet Take 200 mg by mouth 3 (three) times daily.    [name] apixaban (ELIQUIS) 5 MG TABS tablet Take 5 mg by mouth 2 (two) times daily.    [name] aspirin EC 81 MG tablet Take 81 mg by mouth daily.    . carvedilol (COREG) 3.125 MG tablet Take 3.125 mg by mouth 2 (two) times  daily.    . traMADol (ULTRAM) 50 MG tablet Take 25 mg by mouth at bedtime.    [name] zolpidem (AMBIEN) 10 MG tablet Take 5 mg by mouth at bedtime.      Results for orders placed or performed during the hospital encounter of 11/01/16 (from the past 48 hour(s))  Basic metabolic panel     Status: Abnormal   Collection Time: 11/01/16  1:05 AM  Result Value Ref Range   Sodium 137 135 - 145 mmol/L   Potassium 3.6 3.5 - 5.1 mmol/L   Chloride 102 101 - 111 mmol/L   CO2 24 22 - 32 mmol/L   Glucose, Bld 119 (H) 65 - 99 mg/dL   BUN 23 (H) 6 - 20 mg/dL   Creatinine, Ser 1.25 (H) 0.44 - 1.00 mg/dL   Calcium 9.8 8.9 - 10.3 mg/dL   GFR calc non Af Amer 37 (L) >60 mL/min   GFR calc Af Amer 43 (L) >60 mL/min    Comment: (NOTE) The eGFR has been calculated using the CKD EPI equation. This calculation has not been validated in all clinical situations. eGFR's persistently <60 mL/min signify possible Chronic Kidney Disease.    Anion gap 11 5 - 15  CBC     Status: None   Collection Time: 11/01/16  1:05 AM  Result Value Ref Range   WBC 8.0 3.6 - 11.0 K/uL   RBC 5.02  3.80 - 5.20 MIL/uL   Hemoglobin 15.4 12.0 - 16.0 g/dL   HCT 44.4 35.0 - 47.0 %   MCV 88.4 80.0 - 100.0 fL   MCH 30.7 26.0 - 34.0 pg   MCHC 34.7 32.0 - 36.0 g/dL   RDW 12.9 11.5 - 14.5 %   Platelets 194 150 - 440 K/uL  Troponin I     Status: None   Collection Time: 11/01/16  1:05 AM  Result Value Ref Range   Troponin I <0.03 <0.03 ng/mL  TSH     Status: None   Collection Time: 11/01/16  5:02 AM  Result Value Ref Range   TSH 1.997 0.350 - 4.500 uIU/mL    Comment: Performed by a 3rd Generation assay with a functional sensitivity of <=0.01 uIU/mL.  Troponin I     Status: None   Collection Time: 11/01/16  5:02 AM  Result Value Ref Range   Troponin I <0.03 <0.03 ng/mL   Ct Head Wo Contrast  Result Date: 11/01/2016 CLINICAL DATA:  Weakness EXAM: CT HEAD WITHOUT CONTRAST TECHNIQUE: Contiguous axial images were obtained from the base of  the skull through the vertex without intravenous contrast. COMPARISON:  None. FINDINGS: Brain: No mass lesion, intraparenchymal hemorrhage or extra-axial collection. No evidence of acute cortical infarct. There is periventricular hypoattenuation compatible with chronic microvascular disease. Vascular: No hyperdense vessel or unexpected calcification. Skull: Normal visualized skull base, calvarium and extracranial soft tissues. Sinuses/Orbits: No sinus fluid levels or advanced mucosal thickening. No mastoid effusion. Normal orbits. IMPRESSION: Mild chronic microvascular ischemia for age.  No acute abnormality. Electronically Signed   By: Ulyses Jarred M.D.   On: 11/01/2016 02:37   Ct Angio Chest Pe W And/or Wo Contrast  Result Date: 11/01/2016 CLINICAL DATA:  Chest pain and weakness EXAM: CT ANGIOGRAPHY CHEST WITH CONTRAST TECHNIQUE: Multidetector CT imaging of the chest was performed using the standard protocol during bolus administration of intravenous contrast. Multiplanar CT image reconstructions and MIPs were obtained to evaluate the vascular anatomy. CONTRAST:  60 mL Isovue 370 COMPARISON:  Chest radiograph 11/01/2016 FINDINGS: Cardiovascular: Contrast injection is sufficient to demonstrate satisfactory opacification of the pulmonary arteries to the segmental level. There is no pulmonary embolus. The main pulmonary artery is within normal limits for size. There is no CT evidence of acute right heart strain. There is calcific aortic atherosclerosis. There is a normal 3-vessel arch branching pattern. Heart size is mildly enlarged. No pericardial effusion. Mediastinum/Nodes: No mediastinal, hilar or axillary lymphadenopathy. The visualized thyroid and thoracic esophageal course are unremarkable. Lungs/Pleura: 4 mm nodule in the right upper lobe. No pleural effusion or pneumothorax. Bibasilar atelectasis. No focal consolidation. There are multiple perifissural nodules of the left lung measuring up to 3 mm.  Focal area of atelectasis in the left lower lobe. Upper Abdomen: Contrast bolus timing is not optimized for evaluation of the abdominal organs. Within this limitation, the visualized organs of the upper abdomen are normal. Musculoskeletal: No chest wall abnormality. No acute or significant osseous findings. Review of the MIP images confirms the above findings. IMPRESSION: 1. No pulmonary embolus or other acute thoracic abnormality. 2. 4 mm right upper lobe nodule. 3 mm left upper lobe nodules. No follow-up needed if patient is low-risk (and has no known or suspected primary neoplasm). Non-contrast chest CT can be considered in 12 months if patient is high-risk. This recommendation follows the consensus statement: Guidelines for Management of Incidental Pulmonary Nodules Detected on CT Images: From the Fleischner Society 2017; Radiology 2017;  [id number]. 3. Mild cardiomegaly and Aortic Atherosclerosis (ICD10-I70.0). Electronically Signed   By: Ulyses Jarred M.D.   On: 11/01/2016 02:26   Dg Chest Port 1 View  Result Date: 11/01/2016 CLINICAL DATA:  Left-sided chest pain and weakness.  Dyspnea. EXAM: PORTABLE CHEST 1 VIEW COMPARISON:  None. FINDINGS: The heart size and mediastinal contours are within normal limits. Aortic atherosclerosis is noted. Mitral annular calcifications are seen. Mild upper lobe emphysematous hyperinflation. No pneumonic consolidation, pneumothorax, pulmonary edema nor effusion. Osteoarthritis of the [facility] and glenohumeral joints. Soft tissue calcifications are noted along the right rotator cuff. IMPRESSION: 1. Upper lobe predominant emphysematous hyperinflation of the lungs. 2. Aortic atherosclerosis. 3. Right rotator cuff calcific tendinopathy with AC glenohumeral joint osteoarthritis bilaterally. Electronically Signed   By: Ashley Royalty M.D.   On: 11/01/2016 01:28    Review of Systems  Constitutional: Negative for chills and fever.  HENT: Negative for sore throat and tinnitus.   Eyes:  Negative for blurred vision and redness.  Respiratory: Negative for cough and shortness of breath.   Cardiovascular: Negative for chest pain, palpitations, orthopnea and PND.  Gastrointestinal: Negative for abdominal pain, diarrhea, nausea and vomiting.  Genitourinary: Negative for dysuria, frequency and urgency.  Musculoskeletal: Negative for joint pain and myalgias.  Skin: Negative for rash.       No lesions  Neurological: Positive for focal weakness (resolved). Negative for speech change and weakness.  Endo/Heme/Allergies: Does not bruise/bleed easily.       No temperature intolerance  Psychiatric/Behavioral: Negative for depression and suicidal ideas.    Blood pressure (!) 144/82, pulse 96, temperature 97.7 F (36.5 C), temperature source Oral, resp. rate 18, height 5' 4"  (1.626 m), weight 71.4 kg (157 lb 6.4 oz), SpO2 99 %. Physical Exam  Vitals reviewed. Constitutional: She is oriented to person, place, and time. She appears well-developed and well-nourished. No distress.  HENT:  Head: Normocephalic and atraumatic.  Mouth/Throat: Oropharynx is clear and moist.  Eyes: Pupils are equal, round, and reactive to light. Conjunctivae and EOM are normal. No scleral icterus.  Neck: Normal range of motion. Neck supple. No JVD present. No tracheal deviation present. No thyromegaly present.  Cardiovascular: Normal rate and normal heart sounds.  An irregularly irregular rhythm present. Exam reveals no gallop and no friction rub.   No murmur heard. Respiratory: Effort normal and breath sounds normal.  GI: Soft. Bowel sounds are normal. She exhibits no distension. There is no tenderness.  Genitourinary:  Genitourinary Comments: Deferred  Musculoskeletal: Normal range of motion. She exhibits no edema.  Lymphadenopathy:    She has no cervical adenopathy.  Neurological: She is alert and oriented to person, place, and time. No cranial nerve deficit. She exhibits normal muscle tone.  Skin: Skin  is warm and dry. No rash noted. No erythema.  Psychiatric: She has a normal mood and affect. Her behavior is normal. Judgment and thought content normal.     Assessment/Plan This is an 83 year old female admitted for TIA. 1. TIA: Resolved; MRI/MRA of brain ordered. Continue Elavil as. I have added aspirin to the patient's regimen. Consult neurology. 2. Arrhythmia: Irregularly irregular heart beat. The patient states that she was not actually diagnosed with atrial fibrillation. EKG today shows multifocal P waves. Computer interpretation is junctional rhythm but I believe the patient actually has a wandering atrial pacemaker. Continue oral anticoagulation. Monitor telemetry. The patient is chest pain-free. However, she admitted to dyspnea earlier this weekend. She was started on amiodarone but as pulmonary fibrosis and  inflammation can be a side effect of this medication and held it for now. Await cardiology input. 3. AKI: Hydrate with intravenous fluid. Avoid nephrotoxic agents. 4. Hypertension: Intermittently controlled; continue Coreg. 5. DVT prophylaxis: As above 6. GI prophylaxis: None The patient is a full code. Time spent on admission orders and patient care approximately 45 minutes  Harrie Foreman, MD 11/01/2016, 7:41 AM

## 2016-11-01 NOTE — Progress Notes (Signed)
Pt begin discharged home. VSS and no complaints at this time. IV & tele monitor removed. Discharged instructions reviewed with pt and her daughter.

## 2016-11-01 NOTE — ED Notes (Signed)
Patient transported to CT 

## 2016-11-01 NOTE — Consult Note (Signed)
Referring Physician: Nemiah Commander    Chief Complaint: LUE heaviness  HPI: Lynn Hernandez is an 83 y.o. female with a recent history of atrial fibrillation on Eliquis who reports being at home and awakening feeling acutely a cold sensation come over her and difficulty swallowing.  She then noted feeling heavy in her LUE and LLE.  The patient presented for evaluation at that time.  Initial NIHSS of 0.   Patient over the weekend with palpitations and chest pain.  Was diagnosed with atrial fibrillation with RVR and started on Eliquis.  Patient has been complaint with her Eliquis.    Date last known well: Date: 10/31/2016 Time last known well: Time: 23:00 tPA Given: No: On Eliquis  Past Medical History:  Diagnosis Date  . Atrial flutter (HCC)    a. Dx 10/29/2016 - [facility], [place]-->Placed on Eliquis 5 bid and amio.  [name] HTN (hypertension)   . Insomnia   . Low back pain     Past Surgical History:  Procedure Laterality Date  . ABDOMINAL HYSTERECTOMY    . APPENDECTOMY    . PARTIAL COLECTOMY      Family History  Problem Relation Age of Onset  . Healthy Mother        Died of old age @ 30.  [name] Healthy Maternal Grandmother   . Prostate cancer Father   . Prostate cancer Brother   . Healthy Brother    Social History:  reports that she has quit smoking. She has never used smokeless tobacco. She reports that she does not drink alcohol or use drugs.  Allergies: No Known Allergies  Medications: Eliquis, Amiodarone, ASA, Coreg, Ultram, Ambien  ROS: History obtained from the patient  General ROS: negative for - chills, fatigue, fever, night sweats, weight gain or weight loss Psychological ROS: negative for - behavioral disorder, hallucinations, memory difficulties, mood swings or suicidal ideation Ophthalmic ROS: negative for - blurry vision, double vision, eye pain or loss of vision ENT ROS: negative for - epistaxis, nasal discharge, oral lesions, sore throat, tinnitus or  vertigo Allergy and Immunology ROS: negative for - hives or itchy/watery eyes Hematological and Lymphatic ROS: negative for - bleeding problems, bruising or swollen lymph nodes Endocrine ROS: negative for - galactorrhea, hair pattern changes, polydipsia/polyuria or temperature intolerance Respiratory ROS: negative for - cough, hemoptysis, shortness of breath or wheezing Cardiovascular ROS: palpitations, chest pain Gastrointestinal ROS: negative for - abdominal pain, diarrhea, hematemesis, nausea/vomiting or stool incontinence Genito-Urinary ROS: negative for - dysuria, hematuria, incontinence or urinary frequency/urgency Musculoskeletal ROS: low back pain Neurological ROS: as noted in HPI Dermatological ROS: negative for rash and skin lesion changes  Physical Examination: Blood pressure 139/81, pulse 98, temperature 98.2 F (36.8 C), temperature source Oral, resp. rate 18, height  (1.626 m), weight 71.4 kg (157 lb 6.4 oz), SpO2 97 %.  HEENT-  Normocephalic, no lesions, without obvious abnormality.  Normal external eye and conjunctiva.  Normal TM's bilaterally.  Normal auditory canals and external ears. Normal external nose, mucus membranes and septum.  Normal pharynx. Cardiovascular- S1, S2 normal, pulses palpable throughout   Lungs- chest clear, no wheezing, rales, normal symmetric air entry Abdomen- soft, non-tender; bowel sounds normal; no masses,  no organomegaly Extremities- no edema Lymph-no adenopathy palpable Musculoskeletal-no joint tenderness, deformity or swelling Skin-warm and dry, no hyperpigmentation, vitiligo, or suspicious lesions  Neurological Examination   Mental Status: Alert, oriented, thought content appropriate.  Speech fluent without evidence of aphasia.  Able to follow  3 step commands without difficulty. Cranial Nerves: II: Discs flat bilaterally; Visual fields grossly normal, pupils equal, round, reactive to light and accommodation III,IV, VI: ptosis not  present, extra-ocular motions intact bilaterally V,VII: smile symmetric, facial light touch sensation normal bilaterally VIII: hearing normal bilaterally IX,X: gag reflex present XI: bilateral shoulder shrug XII: midline tongue extension Motor: Right : Upper extremity   5/5    Left:     Upper extremity   5/5  Lower extremity   5/5     Lower extremity   5/5 Tone and bulk:normal tone throughout; no atrophy noted Sensory: Pinprick and light touch intact throughout, bilaterally Deep Tendon Reflexes: 2+ and symmetric throughout Plantars: Right: mute   Left: mute Cerebellar: Normal finger-to-nose and normal heel-to-shin testing bilaterally Gait: normal gait and station    Laboratory Studies:  Basic Metabolic Panel:  Recent Labs Lab 11/01/16 0105  NA 137  K 3.6  CL 102  CO2 24  GLUCOSE 119*  BUN 23*  CREATININE 1.25*  CALCIUM 9.8    Liver Function Tests: No results for input(s): AST, ALT, ALKPHOS, BILITOT, PROT, ALBUMIN in the last 168 hours. No results for input(s): LIPASE, AMYLASE in the last 168 hours. No results for input(s): AMMONIA in the last 168 hours.  CBC:  Recent Labs Lab 11/01/16 0105  WBC 8.0  HGB 15.4  HCT 44.4  MCV 88.4  PLT 194    Cardiac Enzymes:  Recent Labs Lab 11/01/16 0105 11/01/16 0502 11/01/16 0945  TROPONINI <0.03 <0.03 <0.03    BNP: Invalid input(s): POCBNP  CBG: No results for input(s): GLUCAP in the last 168 hours.  Microbiology: No results found for this or any previous visit.  Coagulation Studies: No results for input(s): LABPROT, INR in the last 72 hours.  Urinalysis: No results for input(s): COLORURINE, LABSPEC, PHURINE, GLUCOSEU, HGBUR, BILIRUBINUR, KETONESUR, PROTEINUR, UROBILINOGEN, NITRITE, LEUKOCYTESUR in the last 168 hours.  Invalid input(s): APPERANCEUR  Lipid Panel: No results found for: CHOL, TRIG, HDL, CHOLHDL, VLDL, LDLCALC  HgbA1C:  Lab Results  Component Value Date   HGBA1C 5.5 11/01/2016     Urine Drug Screen:  No results found for: LABOPIA, COCAINSCRNUR, LABBENZ, AMPHETMU, THCU, LABBARB  Alcohol Level: No results for input(s): ETH in the last 168 hours.  Other results: EKG: atypical aflutter with variable AV block.  Imaging: Ct Head Wo Contrast  Result Date: 11/01/2016 CLINICAL DATA:  Weakness EXAM: CT HEAD WITHOUT CONTRAST TECHNIQUE: Contiguous axial images were obtained from the base of the skull through the vertex without intravenous contrast. COMPARISON:  None. FINDINGS: Brain: No mass lesion, intraparenchymal hemorrhage or extra-axial collection. No evidence of acute cortical infarct. There is periventricular hypoattenuation compatible with chronic microvascular disease. Vascular: No hyperdense vessel or unexpected calcification. Skull: Normal visualized skull base, calvarium and extracranial soft tissues. Sinuses/Orbits: No sinus fluid levels or advanced mucosal thickening. No mastoid effusion. Normal orbits. IMPRESSION: Mild chronic microvascular ischemia for age.  No acute abnormality. Electronically Signed   By: Deatra Robinson M.D.   On: 11/01/2016 02:37   Ct Angio Chest Pe W And/or Wo Contrast  Result Date: 11/01/2016 CLINICAL DATA:  Chest pain and weakness EXAM: CT ANGIOGRAPHY CHEST WITH CONTRAST TECHNIQUE: Multidetector CT imaging of the chest was performed using the standard protocol during bolus administration of intravenous contrast. Multiplanar CT image reconstructions and MIPs were obtained to evaluate the vascular anatomy. CONTRAST:  60 mL Isovue 370 COMPARISON:  Chest radiograph 11/01/2016 FINDINGS: Cardiovascular: Contrast injection is sufficient to demonstrate satisfactory opacification  of the pulmonary arteries to the segmental level. There is no pulmonary embolus. The main pulmonary artery is within normal limits for size. There is no CT evidence of acute right heart strain. There is calcific aortic atherosclerosis. There is a normal 3-vessel arch branching  pattern. Heart size is mildly enlarged. No pericardial effusion. Mediastinum/Nodes: No mediastinal, hilar or axillary lymphadenopathy. The visualized thyroid and thoracic esophageal course are unremarkable. Lungs/Pleura: 4 mm nodule in the right upper lobe. No pleural effusion or pneumothorax. Bibasilar atelectasis. No focal consolidation. There are multiple perifissural nodules of the left lung measuring up to 3 mm. Focal area of atelectasis in the left lower lobe. Upper Abdomen: Contrast bolus timing is not optimized for evaluation of the abdominal organs. Within this limitation, the visualized organs of the upper abdomen are normal. Musculoskeletal: No chest wall abnormality. No acute or significant osseous findings. Review of the MIP images confirms the above findings. IMPRESSION: 1. No pulmonary embolus or other acute thoracic abnormality. 2. 4 mm right upper lobe nodule. 3 mm left upper lobe nodules. No follow-up needed if patient is low-risk (and has no known or suspected primary neoplasm). Non-contrast chest CT can be considered in 12 months if patient is high-risk. This recommendation follows the consensus statement: Guidelines for Management of Incidental Pulmonary Nodules Detected on CT Images: From the Fleischner Society 2017; Radiology 2017; 284:228-243. 3. Mild cardiomegaly and Aortic Atherosclerosis (ICD10-I70.0). Electronically Signed   By: Deatra Robinson M.D.   On: 11/01/2016 02:26   Mr Brain Wo Contrast  Result Date: 11/01/2016 CLINICAL DATA:  Left upper extremity weakness today. Recent diagnosis of atrial fibrillation. EXAM: MRI HEAD WITHOUT CONTRAST MRA HEAD WITHOUT CONTRAST TECHNIQUE: Multiplanar, multiecho pulse sequences of the brain and surrounding structures were obtained without intravenous contrast. Angiographic images of the head were obtained using MRA technique without contrast. COMPARISON:  Head CT from earlier today FINDINGS: MRI HEAD FINDINGS Brain: No acute infarction,  hemorrhage, hydrocephalus, extra-axial collection or mass lesion. Mild for age cerebral volume loss and chronic periventricular microvascular ischemic change. Vascular: Major vessels are patent. Skull and upper cervical spine: Negative for marrow lesion. Asymmetric left facet arthropathy at C2-3 with mild anterolisthesis. C3-4 facet ankylosis on the right. Sinuses/Orbits: Bilateral cataract resection.  No acute finding. MRA HEAD FINDINGS Symmetric carotid arteries and branching. No stenosis, beading, branch occlusion, or aneurysm. IMPRESSION: No acute finding or explanation for symptoms. Unremarkable brain MRI and MRA for age. Electronically Signed   By: Marnee Spring M.D.   On: 11/01/2016 13:14   Dg Chest Port 1 View  Result Date: 11/01/2016 CLINICAL DATA:  Left-sided chest pain and weakness.  Dyspnea. EXAM: PORTABLE CHEST 1 VIEW COMPARISON:  None. FINDINGS: The heart size and mediastinal contours are within normal limits. Aortic atherosclerosis is noted. Mitral annular calcifications are seen. Mild upper lobe emphysematous hyperinflation. No pneumonic consolidation, pneumothorax, pulmonary edema nor effusion. Osteoarthritis of the [facility] and glenohumeral joints. Soft tissue calcifications are noted along the right rotator cuff. IMPRESSION: 1. Upper lobe predominant emphysematous hyperinflation of the lungs. 2. Aortic atherosclerosis. 3. Right rotator cuff calcific tendinopathy with AC glenohumeral joint osteoarthritis bilaterally. Electronically Signed   By: Tollie Eth M.D.   On: 11/01/2016 01:28   Mr Maxine Glenn Head Wo Contrast  Result Date: 11/01/2016 CLINICAL DATA:  Left upper extremity weakness today. Recent diagnosis of atrial fibrillation. EXAM: MRI HEAD WITHOUT CONTRAST MRA HEAD WITHOUT CONTRAST TECHNIQUE: Multiplanar, multiecho pulse sequences of the brain and surrounding structures were obtained without intravenous contrast. Angiographic  images of the head were obtained using MRA technique without  contrast. COMPARISON:  Head CT from earlier today FINDINGS: MRI HEAD FINDINGS Brain: No acute infarction, hemorrhage, hydrocephalus, extra-axial collection or mass lesion. Mild for age cerebral volume loss and chronic periventricular microvascular ischemic change. Vascular: Major vessels are patent. Skull and upper cervical spine: Negative for marrow lesion. Asymmetric left facet arthropathy at C2-3 with mild anterolisthesis. C3-4 facet ankylosis on the right. Sinuses/Orbits: Bilateral cataract resection.  No acute finding. MRA HEAD FINDINGS Symmetric carotid arteries and branching. No stenosis, beading, branch occlusion, or aneurysm. IMPRESSION: No acute finding or explanation for symptoms. Unremarkable brain MRI and MRA for age. Electronically Signed   By: Marnee Spring M.D.   On: 11/01/2016 13:14    Assessment: 83 y.o. female with a history of atrial fibrillation on Eliquis who presents with a sensation of LUE heaviness.  Symptoms have resolved.  MRI of the brain reviewed and shows no acute changes.  MRA unremarkable.   Carotid dopplers show no evidence of hemodynamically significant stenosis.  Echocardiogram performed at outside facility recently and being obtained by cardiology.  A1c 5.5.  Stroke Risk Factors - atrial fibrillation and hypertension  Plan: 1. Prophylactic therapy-Continue Eliquis and ASA 2. Telemetry monitoring 3. Frequent neuro checks 4. Patient to continue follow up with cardiology   Thana Farr, MD Neurology [phone] 11/01/2016, 2:43 PM

## 2016-11-02 ENCOUNTER — Telehealth: Payer: Self-pay | Admitting: Nurse Practitioner

## 2016-11-02 ENCOUNTER — Emergency Department
Admission: EM | Admit: 2016-11-02 | Discharge: 2016-11-02 | Disposition: A | Discharge status: Home / Self Care | Payer: Medicare Other | Attending: Emergency Medicine | Admitting: Emergency Medicine

## 2016-11-02 ENCOUNTER — Emergency Department: Payer: Medicare Other

## 2016-11-02 DIAGNOSIS — R202 Paresthesia of skin: Secondary | ICD-10-CM | POA: Diagnosis present

## 2016-11-02 DIAGNOSIS — R0600 Dyspnea, unspecified: Secondary | ICD-10-CM | POA: Insufficient documentation

## 2016-11-02 DIAGNOSIS — Z79899 Other long term (current) drug therapy: Secondary | ICD-10-CM | POA: Diagnosis not present

## 2016-11-02 DIAGNOSIS — I10 Essential (primary) hypertension: Secondary | ICD-10-CM | POA: Diagnosis not present

## 2016-11-02 DIAGNOSIS — Z7901 Long term (current) use of anticoagulants: Secondary | ICD-10-CM | POA: Diagnosis not present

## 2016-11-02 DIAGNOSIS — Z87891 Personal history of nicotine dependence: Secondary | ICD-10-CM | POA: Insufficient documentation

## 2016-11-02 LAB — BASIC METABOLIC PANEL
Anion gap: 10 (ref 5–15)
BUN: 22 mg/dL — AB (ref 6–20)
CHLORIDE: 105 mmol/L (ref 101–111)
CO2: 23 mmol/L (ref 22–32)
Calcium: 9.4 mg/dL (ref 8.9–10.3)
Creatinine, Ser: 1.36 mg/dL — ABNORMAL HIGH (ref 0.44–1.00)
GFR calc Af Amer: 39 mL/min — ABNORMAL LOW (ref >60)
GFR calc non Af Amer: 34 mL/min — ABNORMAL LOW (ref >60)
Glucose, Bld: 102 mg/dL — ABNORMAL HIGH (ref 65–99)
POTASSIUM: 3.8 mmol/L (ref 3.5–5.1)
Sodium: 138 mmol/L (ref 135–145)

## 2016-11-02 LAB — CBC
HEMATOCRIT: 42.3 % (ref 35.0–47.0)
Hemoglobin: 14.3 g/dL (ref 12.0–16.0)
MCH: 30.5 pg (ref 26.0–34.0)
MCHC: 33.7 g/dL (ref 32.0–36.0)
MCV: 90.5 fL (ref 80.0–100.0)
Platelets: 206 10*3/uL (ref 150–440)
RBC: 4.67 MIL/uL (ref 3.80–5.20)
RDW: 13.3 % (ref 11.5–14.5)
WBC: 6.8 10*3/uL (ref 3.6–11.0)

## 2016-11-02 LAB — TROPONIN I: Troponin I: <0.03 ng/mL (ref <0.03)

## 2016-11-02 NOTE — ED Provider Notes (Signed)
[facility] Emergency Department Provider Note  Time seen: 5:28 PM  I have reviewed the triage vital signs and the nursing notes.   HISTORY  Chief Complaint Irregular Heart Beat    HPI Lynn Hernandez is a 83 y.o. female With a past medical history of recently diagnosed atrial fib/flutter 10/29/16, hypertension, presents to the emergency department for symptoms of tingling. According to the patient and record review the patient was admitted to the hospital several days ago with symptoms of tingling especially in her left side. At that time the patient was seen by cardiology, her amiodarone was decreased, carvedilol was increased. The patient was also seen by neurology had an MRI/MRA performed and ultimately ruled out for CVA. Patient was discharged home yesterday. She states today around 3 or 4:00 she developed tingling once again along with some mild shortness of breath. Patient states the tingling was fairly diffuse across her entire body. Currently she states very slight tingling in her hands, denies any shortness of breath at this time.  Past Medical History:  Diagnosis Date  . Atrial flutter (HCC)    a. Dx 10/29/2016 - Despina Hick [facility], [place]-->Placed on Eliquis 5 bid and amio.  [name] HTN (hypertension)   . Insomnia   . Low back pain     Patient Active Problem List   Diagnosis Date Noted  . Dyspnea 11/01/2016    Past Surgical History:  Procedure Laterality Date  . ABDOMINAL HYSTERECTOMY    . APPENDECTOMY    . PARTIAL COLECTOMY      Prior to Admission medications   Medication Sig Start Date End Date Taking? Authorizing Provider  amiodarone (PACERONE) 200 MG tablet Take 1 tablet (200 mg total) by mouth 2 (two) times daily. 11/01/16   [name], MD  apixaban (ELIQUIS) 5 MG TABS tablet Take 5 mg by mouth 2 (two) times daily.    [provider]  carvedilol (COREG) 6.25 MG tablet Take 1 tablet (6.25 mg total) by mouth 2 (two) times  daily. 11/01/16   [name], MD  docusate sodium (COLACE) 100 MG capsule Take 1 capsule (100 mg total) by mouth 2 (two) times daily. 11/01/16   [name], MD  pantoprazole (PROTONIX) 40 MG tablet Take 1 tablet (40 mg total) by mouth daily. 11/01/16   [name], MD  traMADol (ULTRAM) 50 MG tablet Take 25 mg by mouth at bedtime.    [provider]  zolpidem (AMBIEN) 10 MG tablet Take 5 mg by mouth at bedtime.    [provider]    No Known Allergies  Family History  Problem Relation Age of Onset  . Healthy Mother        Died of old age @ 72.  [name] Healthy Maternal Grandmother   . Prostate cancer Father   . Prostate cancer Brother   . Healthy Brother     Social History Social History  Substance Use Topics  . Smoking status: Former Games developer  . Smokeless tobacco: Never Used     Comment: smoked 1 pack/wk for about 10 years in her 50's to 51's.  . Alcohol use No    Review of Systems Constitutional: Negative for fever. Cardiovascular: Negative for chest pain. Respiratory: mild shortness breath earlier today, now resolved. Gastrointestinal: Negative for abdominal pain, vomitin and diarrhea. Musculoskeletal: Negative for back pain. Neurological: Negative for headache All other ROS negative  ____________________________________________   PHYSICAL EXAM:  VITAL SIGNS: ED Triage Vitals  Enc Vitals Group  BP --      Pulse Rate 11/02/16 1710 73     Resp 11/02/16 1710 13     Temp 11/02/16 1710 98 F (36.7 C)     Temp Source 11/02/16 1710 Oral     SpO2 11/02/16 1710 97 %     Weight 11/02/16 1711 157 lb (71.2 kg)     Height 11/02/16 1711  (1.626 m)     Head Circumference --      Peak Flow --      Pain Score 11/02/16 1710 0     Pain Loc --      Pain Edu? --      Excl. in GC? --     Constitutional: Alert and oriented. Well appearing and in no distress. Eyes: Normal exam ENT   Head: Normocephalic and atraumatic.    Mouth/Throat: Mucous membranes are moist. Cardiovascular: irregular rhythm, rate around 80 bpm. Respiratory: Normal respiratory effort without tachypnea nor retractions. Breath sounds are clear  Gastrointestinal: Soft and nontender. No distention. Musculoskeletal: Nontender with normal range of motion in all extremities.  Neurologic:  Normal speech and language. No gross focal neurologic deficits  Skin:  Skin is warm, dry and intact.  Psychiatric: Mood and affect are normal.   ____________________________________________    EKG  EKG reviewed and interpreted by myself appears to show atrial fibrillation at 70 bpm, narrow QRS, normal axis, normal intervals with nonspecific ST changes without ST elevation.  ____________________________________________   INITIAL IMPRESSION / ASSESSMENT AND PLAN / ED COURSE  Pertinent labs & imaging results that were available during my care of the patient were reviewed by me and considered in my medical decision making (see chart for details).  patient presents to the emergency department for tingling and mild shortness of breath which is largely resolved at this time per patient. Overall she appears very well, no distress. Differential would include neurologic causes such as CVA, electrolyte abnormality, anxiety. We will check labs. Reassuringly the patient appears very well with no chest pain. Patient underwent an extensive workup recently including MRI/MRA with negative workup. Symptoms could be medication induced, anxiety induced. We will check labs and continue to closely monitor. We will likely discussed with cardiology once workup is performed.  patient's workup is essentially negative. Labs are normal, troponin negative. I discussed the patient with Dr. Okey Dupre of cardiology. He recommends discontinuing the amiodarone and continuing carvedilol and eliquis. As the patient appears very well and symptoms have largely resolved at leave she is safe for discharge  home with cardiology follow-up.  ____________________________________________   FINAL CLINICAL IMPRESSION(S) / ED DIAGNOSES  paresthesias Dyspnea    Minna Antis, MD 11/02/16 [phone]

## 2016-11-02 NOTE — Discharge Instructions (Signed)
as we discussed please discontinue use of your amiodarone. Please continue to take your carvedilol and your blood thinner (Eliquis). Please follow-up with your cardiologist in the next 1 week for recheck/reevaluation. Return to the emergency department for any chest pain, trouble breathing, or any focal weakness/numbness of any arm or leg confusion, slurred speech or significant headache.

## 2016-11-02 NOTE — ED Notes (Signed)
Pt ambulatory to toilet and back. Per. Dr. Lenard Lance pt allowed to drink. Family brought something for pt to drink.

## 2016-11-02 NOTE — ED Triage Notes (Signed)
Pt arrives from home via [facility] for "tingling all over the body". Pt was lying in bed when tingling began. Pt states tingling is better but still present. Denies any pain. Alert and oriented. States x 1 week this has been doing on. Was seen at hospital at [facility] and [facility] admission (DC yesterday) for same. Pt with EMS started in NSR, went in to a flutter, then sinus brady. Pt states she feels like a blushing/flushed feeling when rhythm changes.   Pt denies starting any new meds but states she has come off asa. Taking amiodarone, eliquis, and carvedilol. States some dosage changes within last week. Medications were started at [facility].

## 2016-11-02 NOTE — Telephone Encounter (Signed)
10/8 1:30 Ward Givens, NP   Patient contacted regarding discharge from [facility] on 11/01/16.  Patient understands to follow up with provider Ward Givens, NP on 10/8 at 1:30pm at [facility] [place]. Patient understands discharge instructions? yes Patient understands medications and regiment? yes Patient understands to bring all medications to this visit? yes  Provided directions and phone number for the office in case she has questions before her OV

## 2016-11-03 NOTE — Discharge Summary (Signed)
[facility] -  at [facility]   PATIENT NAME: Lynn Hernandez    MR#:  [id number]  DATE OF BIRTH:  09-Apr-1926  DATE OF ADMISSION:  11/01/2016   ADMITTING PHYSICIAN: Arnaldo Natal, MD  DATE OF DISCHARGE: 11/01/2016  5:42 PM  PRIMARY CARE PHYSICIAN: Dortha Kern, MD   ADMISSION DIAGNOSIS:   weakness  DISCHARGE DIAGNOSIS:   Active Problems:   Dyspnea   SECONDARY DIAGNOSIS:   Past Medical History:  Diagnosis Date  . Atrial flutter (HCC)    a. Dx 10/29/2016 - Despina Hick [facility], [place]-->Placed on Eliquis 5 bid and amio.  [name] HTN (hypertension)   . Insomnia   . Low back pain     HOSPITAL COURSE:   83 year old female with past medical history significant for low back pain and hypertension and new diagnosis of atrial flutter and was started on eliquis last week presents to the hospital secondary to facial flushing.  #1 facial flushing and tingling numbness- still feels secondary to tachycardia, seems to be in sinus rhythm now - also amiodarone started at high dose- ? Side effect -Has atypical flutter with variable conduction on telemetry.  Echo was done at the outside hospital-results not available at this time. -Stroke workup is negative. MRI, MRA is negative. Carotid Dopplers are negative. -Patient already on eliquis will continue that.  #2 atrial flutter-appreciate cardiology consult. New onset atrial flutter diagnosed last week. -We'll decrease the amiodarone at the time of discharge. Increase carvedilol for rate control. Discontinue aspirin as will be on eliquis. -On eliquis for anticoagulation. Likely cardioversion as an outpatient.  #3 hypertension-on Coreg  Ambulated well with physical Therapy, cardiology recommended discharge and outpatient follow up. Discharge today   DISCHARGE CONDITIONS:   Guarded  CONSULTS OBTAINED:   Treatment Team:  Iran Ouch, MD Thana Farr, MD  DRUG ALLERGIES:   No Known  Allergies DISCHARGE MEDICATIONS:   Allergies as of 11/01/2016   No Known Allergies     Medication List    STOP taking these medications   aspirin EC 81 MG tablet     TAKE these medications   amiodarone 200 MG tablet Commonly known as:  PACERONE Take 1 tablet (200 mg total) by mouth 2 (two) times daily. What changed:  when to take this   apixaban 5 MG Tabs tablet Commonly known as:  ELIQUIS Take 5 mg by mouth 2 (two) times daily.   carvedilol 6.25 MG tablet Commonly known as:  COREG Take 1 tablet (6.25 mg total) by mouth 2 (two) times daily. What changed:  medication strength  how much to take   docusate sodium 100 MG capsule Commonly known as:  COLACE Take 1 capsule (100 mg total) by mouth 2 (two) times daily.   pantoprazole 40 MG tablet Commonly known as:  PROTONIX Take 1 tablet (40 mg total) by mouth daily.   traMADol 50 MG tablet Commonly known as:  ULTRAM Take 25 mg by mouth at bedtime.   zolpidem 10 MG tablet Commonly known as:  AMBIEN Take 5 mg by mouth at bedtime.            Discharge Care Instructions        Start     Ordered   11/01/16 0000  amiodarone (PACERONE) 200 MG tablet  2 times daily     11/01/16 1429   11/01/16 0000  carvedilol (COREG) 6.25 MG tablet  2 times daily     11/01/16 1429  11/01/16 0000  docusate sodium (COLACE) 100 MG capsule  2 times daily     11/01/16 1429   11/01/16 0000  pantoprazole (PROTONIX) 40 MG tablet  Daily     11/01/16 1429   11/01/16 0000  Diet - low sodium heart healthy     11/01/16 1429   11/01/16 0000  Activity as tolerated - No restrictions     11/01/16 1429       DISCHARGE INSTRUCTIONS:   1. PCP f/u in 1-2 weeks 2. Cardiology f/u in 1-2 weeks  DIET:   Cardiac diet  ACTIVITY:   Activity as tolerated  OXYGEN:   Home Oxygen: No.  Oxygen Delivery: room air  DISCHARGE LOCATION:   home   If you experience worsening of your admission symptoms, develop shortness of breath, life  threatening emergency, suicidal or homicidal thoughts you must seek medical attention immediately by calling 911 or calling your MD immediately  if symptoms less severe.  You Must read complete instructions/literature along with all the possible adverse reactions/side effects for all the Medicines you take and that have been prescribed to you. Take any new Medicines after you have completely understood and accpet all the possible adverse reactions/side effects.   Please note  You were cared for by a hospitalist during your hospital stay. If you have any questions about your discharge medications or the care you received while you were in the hospital after you are discharged, you can call the unit and asked to speak with the hospitalist on call if the hospitalist that took care of you is not available. Once you are discharged, your primary care physician will handle any further medical issues. Please note that NO REFILLS for any discharge medications will be authorized once you are discharged, as it is imperative that you return to your primary care physician (or establish a relationship with a primary care physician if you do not have one) for your aftercare needs so that they can reassess your need for medications and monitor your lab values.    On the day of Discharge:  VITAL SIGNS:   Blood pressure 139/81, pulse 85, temperature 98.2 F (36.8 C), temperature source Oral, resp. rate 18, height  (1.626 m), weight 71.4 kg (157 lb 6.4 oz), SpO2 97 %.  PHYSICAL EXAMINATION:    GENERAL:  83 y.o.-year-old patient lying in the bed with no acute distress.  EYES: Pupils equal, round, reactive to light and accommodation. No scleral icterus. Extraocular muscles intact.  HEENT: Head atraumatic, normocephalic. Oropharynx and nasopharynx clear.  NECK:  Supple, no jugular venous distention. No thyroid enlargement, no tenderness.  LUNGS: Normal breath sounds bilaterally, no wheezing, rales,rhonchi or  crepitation. No use of accessory muscles of respiration.  CARDIOVASCULAR: S1, S2 normal. No murmurs, rubs, or gallops.  ABDOMEN: Soft, nontender, nondistended. Bowel sounds present. No organomegaly or mass.  EXTREMITIES: No pedal edema, cyanosis, or clubbing.  NEUROLOGIC: Cranial nerves II through XII are intact. Muscle strength 5/5 in all extremities. Sensation intact. Gait not checked.  PSYCHIATRIC: The patient is alert and oriented x 3. Appears anxious SKIN: No obvious rash, lesion, or ulcer.   DATA REVIEW:   CBC  Recent Labs Lab 11/02/16 1720  WBC 6.8  HGB 14.3  HCT 42.3  PLT 206    Chemistries   Recent Labs Lab 11/02/16 1720  NA 138  K 3.8  CL 105  CO2 23  GLUCOSE 102*  BUN 22*  CREATININE 1.36*  CALCIUM 9.4  Microbiology Results  No results found for this or any previous visit.  RADIOLOGY:  Dg Chest 2 View  Result Date: 11/02/2016 CLINICAL DATA:  Atrial fibrillation and flutter. EXAM: CHEST  2 VIEW COMPARISON:  Chest CT 11/01/2016 and CXR 11/01/2016 FINDINGS: Stable cardiomegaly with mild uncoiling of the thoracic aorta. Emphysematous hyperinflation of the lungs upper lobe predominant. No pneumonic consolidation, effusion nor overt pulmonary edema. Aortic atherosclerosis at the arch. Bone island of the right humeral head. Osteoarthritis of the [facility] and glenohumeral joints bilaterally. Thoracic spondylosis. IMPRESSION: Stable cardiomegaly with aortic atherosclerosis.  COPD. Electronically Signed   By: Tollie Eth M.D.   On: 11/02/2016 17:50     Management plans discussed with the patient, family and they are in agreement.  CODE STATUS:  Code Status History    Date Active Date Inactive Code Status Order ID Comments User Context   11/01/2016  4:24 AM 11/01/2016 10:47 PM Full Code [id number]  Arnaldo Natal, MD Inpatient    Advance Directive Documentation     Most Recent Value  Type of Advance Directive  Living will  Pre-existing out of facility DNR order  (yellow form or pink MOST form)  -  "MOST" Form in Place?  -      TOTAL TIME TAKING CARE OF THIS PATIENT: 37 minutes.    [name] M.D on 11/03/2016 at 4:39 PM  Between 7am to 6pm - Pager - [phone]  After 6pm go to [email or web address] - Social research officer, government  [facility]  Office  [phone]  CC: Primary care physician; Dortha Kern, MD   Note: This dictation was prepared with Dragon dictation along with smaller phrase technology. Any transcriptional errors that result from this process are unintentional.

## 2016-11-09 NOTE — Telephone Encounter (Signed)
S/w patient. She reports having continued episodes where she feels a tingling sensation all over her body. She describes that her body feels "compressed" all over her body. She also has coldness in her legs. Shortness of breath is better than it was when she was in the hospital last week.  Today, BP at 0815 was 124/80, HR 93. At 1130 am 134/74, HR 56. Patient is concerned that the Eliquis and carvedilol are still causing her to have these symptoms.  Amiodarone was discontinued after an ER visit on 11/02/16.  She says she never took any medications until going to the hospital in [place] on 10/29/16 for new onset atrial flutter. Denies chest pain. Pt verbalized understanding to call 911 or go to the emergency room, if he develops any new or worsening symptoms. She is aware of upcoming appointment on 11/15/16 with Ward Givens, NP.  Routing to Ward Givens, NP for review.

## 2016-11-09 NOTE — Telephone Encounter (Signed)
Patient c/o to daughter of continued episodes  Daughter has questions about medication s/p hospital visit

## 2016-11-15 ENCOUNTER — Ambulatory Visit: Payer: Medicare Other | Admitting: Nurse Practitioner

## 2016-11-24 NOTE — ED Provider Notes (Signed)
[facility] Emergency Department Provider Note    First MD Initiated Contact with Patient 11/01/16 0045     (approximate)  I have reviewed the triage vital signs and the nursing notes.   HISTORY  Chief Complaint Weakness and Chest Pain    HPI Lynn Hernandez is a 83 y.o. female with globus of chronic medical conditions including recently diagnosed atrial fibrillation and hypertension at a hospital in [facility] and was started on a course.presents to the emergency department with left upper extremity weakness. Patient states that she felt as though her tongue was heavy. Patient denies any headache no nausea or vomiting. Patient denies any visual changes.   Past Medical History:  Diagnosis Date  . Atrial flutter (HCC)    a. Dx 10/29/2016 - Despina Hernandez [facility], [place]-->Placed on Eliquis 5 bid and amio.  Lynn Hernandez HTN (hypertension)   . Insomnia   . Low back pain     Patient Active Problem List   Diagnosis Date Noted  . Dyspnea 11/01/2016    Past Surgical History:  Procedure Laterality Date  . ABDOMINAL HYSTERECTOMY    . APPENDECTOMY    . PARTIAL COLECTOMY      Prior to Admission medications   Medication Sig Start Date End Date Taking? Authorizing Provider  apixaban (ELIQUIS) 5 MG TABS tablet Take 5 mg by mouth 2 (two) times daily.   Yes [provider]  traMADol (ULTRAM) 50 MG tablet Take 25 mg by mouth at bedtime.   Yes [provider]  zolpidem (AMBIEN) 10 MG tablet Take 5 mg by mouth at bedtime.   Yes [provider]  amiodarone (PACERONE) 200 MG tablet Take 1 tablet (200 mg total) by mouth 2 (two) times daily. Patient not taking: Reported on 11/09/2016 11/01/16   Lynn Baas, MD  carvedilol (COREG) 6.25 MG tablet Take 1 tablet (6.25 mg total) by mouth 2 (two) times daily. 11/01/16   Lynn Baas, MD  docusate sodium (COLACE) 100 MG capsule Take 1 capsule (100 mg total) by mouth 2 (two) times daily. 11/01/16    Lynn Baas, MD  pantoprazole (PROTONIX) 40 MG tablet Take 1 tablet (40 mg total) by mouth daily. 11/01/16   Lynn Baas, MD    Allergies Patient has no known allergies.  Family History  Problem Relation Age of Onset  . Healthy Mother        Died of old age @ 32.  Lynn Hernandez Healthy Maternal Grandmother   . Prostate cancer Father   . Prostate cancer Brother   . Healthy Brother     Social History Social History  Substance Use Topics  . Smoking status: Former Games developer  . Smokeless tobacco: Never Used     Comment: smoked 1 pack/wk for about 10 years in her 50's to 27's.  . Alcohol use No    Review of Systems Constitutional: No fever/chills Eyes: No visual changes. ENT: No sore throat. Cardiovascular: Denies chest pain. Respiratory: Denies shortness of breath. Gastrointestinal: No abdominal pain.  No nausea, no vomiting.  No diarrhea.  No constipation. Genitourinary: Negative for dysuria. Musculoskeletal: Negative for neck pain.  Negative for back pain.positive for left upper extremity weakness Integumentary: Negative for rash. Neurological: Negative for headaches, focal weakness or numbness.   ____________________________________________   PHYSICAL EXAM:  VITAL SIGNS: ED Triage Vitals [11/01/16 0105]  Enc Vitals Group     BP (!) 167/92     Pulse Rate 92     Resp 16  Temp 98.2 F (36.8 C)     Temp Source Oral     SpO2 97 %     Weight 70.8 kg (156 lb)     Height 1.626 m (5\' 4" )     Head Circumference      Peak Flow      Pain Score 0     Pain Loc      Pain Edu?      Excl. in GC?     Constitutional: Alert and oriented. Well appearing and in no acute distress. Eyes: Conjunctivae are normal. PERRL. EOMI. Head: Atraumatic. Ears:  Healthy appearing ear canals and TMs bilaterally Nose: No congestion/rhinnorhea. Mouth/Throat: Mucous membranes are moist.  Oropharynx non-erythematous. Neck: No stridor.  No meningeal signs. Cardiovascular: Normal rate,  regular rhythm. Good peripheral circulation. Grossly normal heart sounds. Respiratory: Normal respiratory effort.  No retractions. Lungs CTAB. Gastrointestinal: Soft and nontender. No distention.  Musculoskeletal: No lower extremity tenderness nor edema. No gross deformities of extremities. Neurologic:  Normal speech and language. No gross focal neurologic deficits are appreciated.  Skin:  Skin is warm, dry and intact. No rash noted. Psychiatric: Mood and affect are normal. Speech and behavior are normal.  ____________________________________________   LABS (all labs ordered are listed, but only abnormal results are displayed)  Labs Reviewed  BASIC METABOLIC PANEL - Abnormal; Notable for the following:       Result Value   Glucose, Bld 119 (*)    BUN 23 (*)    Creatinine, Ser 1.25 (*)    GFR calc non Af Amer 37 (*)    GFR calc Af Amer 43 (*)    All other components within normal limits  CBC  TROPONIN I  TSH  HEMOGLOBIN A1C  TROPONIN I  TROPONIN I  TROPONIN I   ____________________________________________  EKG  ED ECG REPORT I, [place] Lynn Hernandez, the attending physician, personally viewed and interpreted this ECG.   Date: 11/24/2016  EKG Time: 1:02 AM  Rate: 98  Rhythm:normal sinus rhythm  Axis: normal  Intervals:normal  ST&T Change: on  ____________________________________________  RADIOLOGY I, Lynn Hernandez, personally viewed and evaluated these images (plain radiographs) as part of my medical decision making, as well as reviewing the written report by the radiologist.  CLINICAL DATA:  Weakness  EXAM: CT HEAD WITHOUT CONTRAST  TECHNIQUE: Contiguous axial images were obtained from the base of the skull through the vertex without intravenous contrast.  COMPARISON:  None.  FINDINGS: Brain: No mass lesion, intraparenchymal hemorrhage or extra-axial collection. No evidence of acute cortical infarct. There is periventricular hypoattenuation compatible  with chronic microvascular disease.  Vascular: No hyperdense vessel or unexpected calcification.  Skull: Normal visualized skull base, calvarium and extracranial soft tissues.  Sinuses/Orbits: No sinus fluid levels or advanced mucosal thickening. No mastoid effusion. Normal orbits.  IMPRESSION: Mild chronic microvascular ischemia for age.  No acute abnormality.   Electronically Signed   By: Deatra Robinson M.D.   On: 11/01/2016    Procedures   ____________________________________________   INITIAL IMPRESSION / ASSESSMENT AND PLAN / ED COURSE  As part of my medical decision making, I reviewed the following data within the electronic MEDICAL RECORD [id number] year old female presenting with above stated history of physical exam. Concern for possible CVA and a such CT scan of the head was performed which was unremarkable. Patient with continued symptoms and a such patient discussed with Dr. Frederica Kuster admission for further evaluation and management     ____________________________________________  FINAL  CLINICAL IMPRESSION(S) / ED DIAGNOSES  Final diagnoses:  Left-sided weakness     MEDICATIONS GIVEN DURING THIS VISIT:  Medications  iopamidol (ISOVUE-370) 76 % injection 60 mL (60 mLs Intravenous Contrast Given 11/01/16 0159)  LORazepam (ATIVAN) tablet 1 mg (1 mg Oral Given 11/01/16 1130)  bisacodyl (DULCOLAX) EC tablet 10 mg (10 mg Oral Given 11/01/16 1520)     NEW OUTPATIENT MEDICATIONS STARTED DURING THIS VISIT:  Discharge Medication List as of 11/01/2016  6:05 PM    START taking these medications   Details  docusate sodium (COLACE) 100 MG capsule Take 1 capsule (100 mg total) by mouth 2 (two) times daily., Starting Mon 11/01/2016, Normal    pantoprazole (PROTONIX) 40 MG tablet Take 1 tablet (40 mg total) by mouth daily., Starting Mon 11/01/2016, Normal        Discharge Medication List as of 11/01/2016  6:05 PM    CONTINUE these medications which have  CHANGED   Details  amiodarone (PACERONE) 200 MG tablet Take 1 tablet (200 mg total) by mouth 2 (two) times daily., Starting Mon 11/01/2016, Normal    carvedilol (COREG) 6.25 MG tablet Take 1 tablet (6.25 mg total) by mouth 2 (two) times daily., Starting Mon 11/01/2016, Normal        Discharge Medication List as of 11/01/2016  6:05 PM    STOP taking these medications     aspirin EC 81 MG tablet Comments:  Reason for Stopping:           Note:  This document was prepared using Dragon voice recognition software and may include unintentional dictation errors.    Darci CurrentBrown, [place] N, MD 11/24/16 [phone]

## 2017-03-17 DIAGNOSIS — I484 Atypical atrial flutter: Secondary | ICD-10-CM | POA: Insufficient documentation

## 2017-03-17 DIAGNOSIS — Z7189 Other specified counseling: Secondary | ICD-10-CM | POA: Insufficient documentation

## 2017-03-17 DIAGNOSIS — I10 Essential (primary) hypertension: Secondary | ICD-10-CM | POA: Insufficient documentation

## 2017-03-17 NOTE — Progress Notes (Signed)
Cardiology Office Note  Date:  03/21/2017   ID:  Richardean SaleBetty B Rinck, DOB 04/17/1926, MRN [id number]  PCP:  Dortha KernBliss, Laura K, MD   Chief Complaint  Patient presents with  . other    Patient would like a 2nd opinion for A-Fib; [facility]. Pt. took a fall last night when trying to pull the door knob and it fell off causing her to hit the porch and a post; she did hit her head, eye and arm. She did not take Eliquis last night.     HPI:  Ms. Redmond Schoolripp is a 84 year old woman with past medical history of Hypertension Insomnia Low back pain History of atrial flutter, started on anticoagulation and amiodarone October 29, 2016 Who presents for new patient evaluation for atrial fibrillation September 2018 and atrial flutter October 2018  She reports having several emergency room visits 1 hospital admission September 2018 Reported that she did not feel well around September 24 Profound weakness Daughter reports that she was not drinking, very dehydrated Was evaluated by EMTs Finally transferred to the hospital, noted to be in atrial flutter  Notes indicating left upper extremity heaviness Significant workup done in the hospital per notes MRI brain no acute changes Carotid Dopplers no significant stenosis Echo outside facility, essentially normal  CTA chest with no pulmonary embolism For atrial flutter was started On amiodarone, anticoagulation, carvedilol  EKG September 2018 showing atrial fibrillation by report EKG October 20 showing normal sinus rhythm EKG October 28 showing atrial flutter EKG November 11, 2016 showing atrial fibrillation EKG January 10, 2017 showing normal sinus rhythm  Stress test was low risk January 05, 2017 PFT also performed  Notes indicating she was having episodes of atrial fibrillation November 10, 2016 At that point she was restarted on amiodarone load  CHADS VASC of 4  Worsening atrial fibrillation when she  stopped the amiodarone That is why this was restarted Was having runs up to 16 hours Reports today she is only taking 100 mg daily  Reports that she had a fall last night pulled on a door handle came out of the door fell backwards hit her left eye, large region of ecchymoses Did not take Eliquis last night or this morning  Reports that she would like to stop the amiodarone, reports that it makes her feel tight in the chest, sore throat  EKG personally reviewed by myself on todays visit Shows normal sinus rhythm rate 67 bpm nonspecific ST abnormality, U waves   PMH:   has a past medical history of Atrial flutter (HCC), HTN (hypertension), Insomnia, and Low back pain.  PSH:    Past Surgical History:  Procedure Laterality Date  . ABDOMINAL HYSTERECTOMY    . APPENDECTOMY    . PARTIAL COLECTOMY      Current Outpatient Medications  Medication Sig Dispense Refill  . ALPRAZolam (XANAX) 0.25 MG tablet TAKE 1 TABLET BY MOUTH TWICE A DAY AS NEEDED FOR ANXIETY  2  . apixaban (ELIQUIS) 5 MG TABS tablet Take 5 mg by mouth 2 (two) times daily.    . Ascorbic Acid (VITAMIN C) 500 MG CHEW Chew 500 mg by mouth 2 (two) times daily.    . Glucosamine HCl 1000 MG TABS Take by mouth daily.    . hydrochlorothiazide (HYDRODIURIL) 25 MG tablet Take 25 mg by mouth daily.    [name]. losartan (COZAAR) 50 MG tablet Take 50 mg by mouth daily.  3  .  Multiple Vitamins-Minerals (MULTI-VITAMIN GUMMIES) CHEW Chew by mouth daily.     . Probiotic Product (PROBIOTIC-10 ULTIMATE) CAPS Take by mouth.    . traMADol (ULTRAM) 50 MG tablet Take 25 mg by mouth at bedtime.    . Turmeric 500 MG TABS Take by mouth daily.    [name] zolpidem (AMBIEN) 10 MG tablet Take 5 mg by mouth at bedtime.     No current facility-administered medications for this visit.      Allergies:   Other   Social History:  The patient  reports that she has quit smoking. she has never used smokeless tobacco. She reports that she does not drink alcohol or use  drugs.   Family History:   family history includes Healthy in her brother, maternal grandmother, and mother; Prostate cancer in her brother and father.    Review of Systems: Review of Systems  Constitutional: Negative.   Respiratory: Negative.   Cardiovascular: Negative.   Gastrointestinal: Negative.   Musculoskeletal: Negative.   Neurological: Negative.   Psychiatric/Behavioral: The patient is nervous/anxious.   All other systems reviewed and are negative.    PHYSICAL EXAM: VS:  BP 140/60 (BP Location: Right Arm, Patient Position: Sitting, Cuff Size: Normal)   Pulse 67   Ht 5\' 4"  (1.626 m)   Wt 150 lb 9 oz (68.3 kg)   BMI 25.84 kg/m  , BMI Body mass index is 25.84 kg/m. GEN: Well nourished, well developed, in no acute distress , anxious HEENT: normal  Neck: no JVD, carotid bruits, or masses Cardiac: RRR; no murmurs, rubs, or gallops,no edema  Respiratory:  clear to auscultation bilaterally, normal work of breathing GI: soft, nontender, nondistended, + BS MS: no deformity or atrophy  Skin: warm and dry, no rash,  Large region of ecchymoses left eye  neuro:  Strength and sensation are intact Psych: euthymic mood, full affect    Recent Labs: 11/01/2016: TSH 1.997 11/02/2016: BUN 22; Creatinine, Ser 1.36; Hemoglobin 14.3; Platelets 206; Potassium 3.8; Sodium 138    Lipid Panel No results found for: CHOL, HDL, LDLCALC, TRIG    Wt Readings from Last 3 Encounters:  03/21/17 150 lb 9 oz (68.3 kg)  11/02/16 157 lb (71.2 kg)  11/01/16 157 lb 6.4 oz (71.4 kg)       ASSESSMENT AND PLAN:  Atypical atrial flutter (HCC) - Plan: EKG 12-Lead Noted on EKG, Recommend she restart Eliquis in 1 or 2 days given recent fall She does have poor balance, difficulty getting off the exam table We will need to monitor closely Daughter will help monitor at home  Essential hypertension Blood pressure is well controlled on today's visit. No changes made to the  medications. Recommended she cut HCTZ in half given worsening renal dysfunction Also recommend she put losartan in the evening HCTZ in the morning Monitor blood pressure at home  Encounter for anticoagulation discussion and counseling Chads VAsc of 4 We will need to watch closely given fall yesterday, poor balance Ideally should stay on Eliquis  Paroxysmal atrial fibrillation (HCC) She wants to stop her amiodarone, reports having side effects We will stop amiodarone for now but will likely need alternate antiarrhythmic such as flecainide Recent normal stress test, normal echocardiogram  Anxiety Has Xanax to take as needed  Disposition:   F/U  1 month   Total encounter time more than 60 minutes  Greater than 50% was spent in counseling and coordination of care with the patient    Orders Placed This Encounter  Procedures  .  EKG 12-Lead     Signed, Dossie Arbour, M.D., Ph.D. 03/21/2017  [facility] [place], [place] [phone]

## 2017-03-21 ENCOUNTER — Ambulatory Visit (INDEPENDENT_AMBULATORY_CARE_PROVIDER_SITE_OTHER): Payer: Medicare Other | Admitting: Cardiovascular Disease

## 2017-03-21 ENCOUNTER — Encounter: Payer: Self-pay | Admitting: Cardiovascular Disease

## 2017-03-21 VITALS — BP 140/60 | HR 67 | Ht 64.0 in | Wt 150.6 lb

## 2017-03-21 DIAGNOSIS — Z7189 Other specified counseling: Secondary | ICD-10-CM

## 2017-03-21 DIAGNOSIS — I10 Essential (primary) hypertension: Secondary | ICD-10-CM

## 2017-03-21 DIAGNOSIS — W19XXXA Unspecified fall, initial encounter: Secondary | ICD-10-CM

## 2017-03-21 DIAGNOSIS — F419 Anxiety disorder, unspecified: Secondary | ICD-10-CM

## 2017-03-21 DIAGNOSIS — I480 Paroxysmal atrial fibrillation: Secondary | ICD-10-CM

## 2017-03-21 DIAGNOSIS — I484 Atypical atrial flutter: Secondary | ICD-10-CM

## 2017-03-21 NOTE — Patient Instructions (Addendum)
Medication Instructions:   Please hold the amiodarone  Move the losartan to the evening  Cut the HCTZ in 1/2 daily in the Am  Check blood pressure when you are dizzy Check pressure sitting and standing   Labwork:  No new labs needed  Testing/Procedures:  No further testing at this time   Follow-Up: It was a pleasure seeing you in the office today. Please call us if you have new issues that need to be addressed before your next appt.  [phone]  Your physician wants you to follow-up in: 1 month.    If you need a refill on your cardiac medications before your next appointment, please call your pharmacy.

## 2017-03-23 ENCOUNTER — Telehealth: Payer: Self-pay | Admitting: Cardiovascular Disease

## 2017-03-23 NOTE — Telephone Encounter (Signed)
Patient was seen Monday  03/21/17 for atrial fibrillation and flutter We did not have records at the time to make a clear decision concerning her anticoagulation After further review of the notes would recommend she stay on Eliquis 5 twice daily She has demonstrated atrial fibrillation also demonstrated atrial flutter, 2 different heart arrhythmias Had these on numerous episodes dating back through September and October chads vasc was 4 Despite recent fall and bruising to her left eye would consider restarting the anticoagulation thx TGollan

## 2017-03-24 NOTE — Telephone Encounter (Signed)
Spoke with patient and she reports that she was instructed to restart once bruising was better. Reviewed Dr. Windell HummingbirdGollan's recommendations to restart and she wanted to know when she should do that. She had fall last Sunday and bruising is still pretty bad per her reports. Recommended that she resume on Monday and to continue monitoring her blood pressures and using extreme caution when changing positions. She verbalized understanding of our conversation, agreement with plan, and had no further questions at this time.

## 2017-03-25 ENCOUNTER — Telehealth: Payer: Self-pay | Admitting: Cardiovascular Disease

## 2017-03-25 NOTE — Telephone Encounter (Signed)
Pt daughter , who is on DPR, is calling with BP readings 2/13 seated-137/71 HR 79, standing 107/66 HR 83, AFTERNOON, seated 117/59  HR 71, sitting 107/65 HR 75 2/14 am-seated 119/64 HR 74, standing 109/68 HR 74, AFTERNOON seated 127/59 HR 71, standing 95/57 HR 70 2/15 morning seated 123/54 HR 64, standing 105/61 HR 63  An hour later seated 115/48 HR 63, standing 97/52 HR 68 Just now seated 89/55 HR 78.  Daughter request we call her at [phone].  Also please address how pt should be taking Eliquis.

## 2017-03-27 NOTE — Telephone Encounter (Signed)
Stop the HCTZ, Continue to encourage po fluid intake as BP is dropping with standing If BP continunes to drop low with standing, Call our office, We would likely need to cut the losartan in 1/2 daily

## 2017-03-28 NOTE — Telephone Encounter (Signed)
S/w patient's daughter, ok per DPR.  She verbalized understanding to stop HCTZ and encourage PO intake. She will call us if needed if BP continues to remain low. Patient has f/u with Dr Mariah MillingGollan on 2/26 scheduled.

## 2017-04-01 ENCOUNTER — Telehealth: Payer: Self-pay | Admitting: Cardiovascular Disease

## 2017-04-01 NOTE — Telephone Encounter (Signed)
Pt was advised 2/11 to monitor BP. She has appt next week on 2/26 w/ Dr. Mariah MillingGollan.

## 2017-04-01 NOTE — Telephone Encounter (Signed)
°  Date Time   BP  HR   2/22 1000  153/71  58 Sitting    146/68  59 Standing   1600  170/85  73 Sitting    168/80  70 Standing  Patient has not felt good all day and has taken xanax for the day

## 2017-04-01 NOTE — Telephone Encounter (Signed)
Spoke w/ pt's daughter, Nelva Bushorma. Advised her that I have forwarded pt's readings to Dr. Mariah MillingGollan, but he is in clinic today and has not had a chance to review. Advised her that readings look good and most likely not contributing to pt's sx. She reports that pt checks her BP frequently thru the day, which may be causing some additional anxiety.  She reports that she was initially giving pt some of her xanax to help w/ this, but pt's PCP recently wrote pt her own rx for this, which seems to be helping.  Advised her to continue to monitor and keep appt w/ Dr. Mariah MillingGollan next week.  She is agreeable and appreciative of the call.

## 2017-04-01 NOTE — Telephone Encounter (Signed)
° °  Date  Time   BP   HR  2/18  0815  145/71  65 Sitting     108/65  66 Standing       1830  126/63  60 Sitting      109/59  64 Standing   2/19  0930  127/60  61 Sitting     109/54  54 Standing       1800  140/64  60 Sitting      116/67  61 Standing   2/20-  Patient not feeling well this day had company this day consumed more Na+ and less fluid  2/20   0945  139/63  73 Sitting      108/60  71 Standing     1330  137/57  63 Sitting     115/62  65 Standing     1615  145/66  65 Sitting      141/74  63 Standing      2215  158/78  63 Sitting     134/71  66 Standing    2/21  1045  131/55  58 Sitting     112/62  60 Standing      1245 Had a headache took Tylenol    1315  137/63  73 Sitting     123/63  76 Standing

## 2017-04-03 NOTE — Telephone Encounter (Signed)
Most BP looks good, Maybe ran higher when she did not feel good? May not need to check it as much

## 2017-04-03 NOTE — Progress Notes (Signed)
Cardiology Office Note  Date:  04/05/2017   ID:  Lynn Hernandez, DOB 1926-11-26, MRN [id number]  PCP:  Dortha Kern, MD   Chief Complaint  Patient presents with  . other    2 week follow up. Meds reviewed by the pt. verbally. Pt. c/o shortness of breath with chest pain at times.     HPI:  Lynn Hernandez is a 84 year old woman with past medical history of Remote smoker, social Hypertension Insomnia Low back pain History of atrial flutter, started on anticoagulation and amiodarone October 29, 2016 CHADS VASC of 4 Who presents for f/u of her for atrial fibrillation/flutter  In follow-up today she reports that she feels well with no complaints Denies any tachycardia or palpitations She brings with her blood pressure measurements ranging from 120 up to 170 systolic Occasionally with tightness in her chest, presenting at rest Reports that when she has tightness her blood pressure will run high Otherwise active, no shortness of breath or chest pain on exertion Attributes her chest tightness to amiodarone May be getting somewhat better as she stopped the pill 2-3 weeks ago  She is taking Eliquis 5 twice daily Reports it is $400 Suspect this is her deductible through Medicare  Notes from Dr. Quillian Quince reviewed She is addressing anxiety, panic attacks  Patient reports she feels calm today  Lab work reviewed showing basic metabolic panel from June 2018 creatinine 1.04  EKG personally reviewed by myself on todays visit Shows normal sinus rhythm with rate 77 bpm rare PVC  Other past medical history reviewed  several emergency room visits 1 hospital admission September 2018 Reported that she did not feel well around September 24 Profound weakness Daughter reports that she was not drinking, very dehydrated Was evaluated by EMTs Finally transferred to the hospital, noted to be in atrial flutter  Notes indicating left upper extremity heaviness Significant workup done in the hospital per  notes MRI brain no acute changes Carotid Dopplers no significant stenosis Echo outside facility, essentially normal  CTA chest with no pulmonary embolism For atrial flutter was started On amiodarone, anticoagulation, carvedilol  EKG September 2018 showing atrial fibrillation by report EKG October 20 showing normal sinus rhythm EKG October 28 showing atrial flutter EKG November 11, 2016 showing atrial fibrillation ECG - 11/17/16 - Atrial fibrillation , ventricular rate 81 bpm. LAD EKG January 10, 2017 showing normal sinus rhythm  Stress test was low risk January 05, 2017 PFT also performed  Notes indicating she was having episodes of atrial fibrillation November 10, 2016 At that point she was restarted on amiodarone load  Worsening atrial fibrillation when she stopped the amiodarone  was restarted Was having runs up to 16 hours Last visit was only taking 100 mg daily  On last clinic visit had a fall stable for office visit, pulled on a door handle came out of the door fell backwards hit her left eye, large region of ecchymoses Did not take Eliquis last night or this morning  03/21/2017 Stopped amiodarone secondary to reported chest tightness, sore throat, dizziness    PMH:   has a past medical history of Atrial flutter (HCC), HTN (hypertension), Insomnia, and Low back pain.  PSH:    Past Surgical History:  Procedure Laterality Date  . ABDOMINAL HYSTERECTOMY    . APPENDECTOMY    . PARTIAL COLECTOMY      Current Outpatient Medications  Medication Sig Dispense Refill  . ALPRAZolam (XANAX) 0.25 MG tablet TAKE 1 TABLET BY MOUTH TWICE A  DAY AS NEEDED FOR ANXIETY  2  . apixaban (ELIQUIS) 5 MG TABS tablet Take 5 mg by mouth 2 (two) times daily.    . Ascorbic Acid (VITAMIN C) 500 MG CHEW Chew 500 mg by mouth 2 (two) times daily.    . Glucosamine HCl 1000 MG TABS Take by mouth daily.    [name]. losartan (COZAAR) 25 MG tablet Take 25 mg by mouth daily.    . Multiple Vitamins-Minerals  (MULTI-VITAMIN GUMMIES) CHEW Chew by mouth daily.     . Probiotic Product (PROBIOTIC-10 ULTIMATE) CAPS Take by mouth.    . traMADol (ULTRAM) 50 MG tablet Take 25 mg by mouth at bedtime.    . Turmeric 500 MG TABS Take by mouth daily.    [name]. zolpidem (AMBIEN) 10 MG tablet Take 5 mg by mouth at bedtime.     No current facility-administered medications for this visit.      Allergies:   Other   Social History:  The patient  reports that she has quit smoking. she has never used smokeless tobacco. She reports that she does not drink alcohol or use drugs.   Family History:   family history includes Healthy in her brother, maternal grandmother, and mother; Prostate cancer in her brother and father.    Review of Systems: Review of Systems  Constitutional: Negative.   Respiratory: Negative.   Cardiovascular: Negative.   Gastrointestinal: Negative.   Musculoskeletal: Negative.   Neurological: Negative.   Psychiatric/Behavioral: The patient is nervous/anxious.   All other systems reviewed and are negative.    PHYSICAL EXAM: VS:  BP (!) 162/62 (BP Location: Left Arm, Patient Position: Sitting, Cuff Size: Normal)   Pulse 77   Ht 5' 4.5" (1.638 m)   Wt 150 lb 4 oz (68.2 kg)   BMI 25.39 kg/m  , BMI Body mass index is 25.39 kg/m. Constitutional:  oriented to person, place, and time. No distress.  HENT:  Head: Normocephalic and atraumatic.  Eyes:  no discharge. No scleral icterus.  Neck: Normal range of motion. Neck supple. No JVD present.  Cardiovascular: Normal rate, regular rhythm, normal heart sounds and intact distal pulses. Exam reveals no gallop and no friction rub. No edema No murmur heard. Pulmonary/Chest: Effort normal and breath sounds normal. No stridor. No respiratory distress.  no wheezes.  no rales.  no tenderness.  Abdominal: Soft.  no distension.  no tenderness.  Musculoskeletal: Normal range of motion.  no  tenderness or deformity.  Neurological:  normal muscle tone.  Coordination normal. No atrophy Skin: Skin is warm and dry. No rash noted. not diaphoretic.  Psychiatric:  normal mood and affect. behavior is normal. Thought content normal.    Recent Labs: 11/01/2016: TSH 1.997 11/02/2016: BUN 22; Creatinine, Ser 1.36; Hemoglobin 14.3; Platelets 206; Potassium 3.8; Sodium 138    Lipid Panel No results found for: CHOL, HDL, LDLCALC, TRIG    Wt Readings from Last 3 Encounters:  04/05/17 150 lb 4 oz (68.2 kg)  03/21/17 150 lb 9 oz (68.3 kg)  11/02/16 157 lb (71.2 kg)       ASSESSMENT AND PLAN:  Atypical atrial flutter (HCC) - Plan: EKG 12-Lead Recommend she stay on Eliquis 5 twice daily Took herself off amiodarone We will continue current medications Will need other antiarrhythmic if she has recurrent arrhythmia  Essential hypertension Blood pressure running high Recommended she increase losartan back to 50 mg daily Blood pressure on my check 180 systolic On arrival was 160 Blood pressure at home  ranging from 120 up to 170s  Encounter for anticoagulation discussion and counseling Chads VAsc of 4  on Eliquis 5 twice daily  Paroxysmal atrial fibrillation (HCC) Recent normal stress test, normal echocardiogram Amiodarone previously held at her request Would use different antiarrhythmic for recurrent arrhythmia  Anxiety Has Xanax to take as needed  Disposition:   F/U  6 month   Total encounter time more than 45 minutes  Greater than 50% was spent in counseling and coordination of care with the patient    No orders of the defined types were placed in this encounter.    Signed, Dossie Arbour, M.D., Ph.D. 04/05/2017  [facility] [place], [place] [phone]

## 2017-04-04 NOTE — Telephone Encounter (Signed)
Spoke with patients daughter per release form and reviewed Dr. Windell HummingbirdGollan's recommendations. Confirmed appointment for tomorrow. She was appreciative for the call and had no further questions at this time.

## 2017-04-05 ENCOUNTER — Ambulatory Visit (INDEPENDENT_AMBULATORY_CARE_PROVIDER_SITE_OTHER): Payer: Medicare Other | Admitting: Cardiovascular Disease

## 2017-04-05 ENCOUNTER — Encounter: Payer: Self-pay | Admitting: Cardiovascular Disease

## 2017-04-05 VITALS — BP 162/62 | HR 77 | Ht 64.5 in | Wt 150.2 lb

## 2017-04-05 DIAGNOSIS — W19XXXA Unspecified fall, initial encounter: Secondary | ICD-10-CM

## 2017-04-05 DIAGNOSIS — I484 Atypical atrial flutter: Secondary | ICD-10-CM

## 2017-04-05 DIAGNOSIS — I480 Paroxysmal atrial fibrillation: Secondary | ICD-10-CM

## 2017-04-05 DIAGNOSIS — Z7189 Other specified counseling: Secondary | ICD-10-CM

## 2017-04-05 DIAGNOSIS — I10 Essential (primary) hypertension: Secondary | ICD-10-CM

## 2017-04-05 DIAGNOSIS — F419 Anxiety disorder, unspecified: Secondary | ICD-10-CM | POA: Diagnosis not present

## 2017-04-05 MED ORDER — LOSARTAN POTASSIUM 50 MG PO TABS: 50.0000 mg | ORAL_TABLET | Freq: Every day | ORAL | 3 refills | Status: AC

## 2017-04-05 NOTE — Progress Notes (Signed)
Patient assistance forms for Eliquis completed and provided to patient.

## 2017-04-05 NOTE — Patient Instructions (Addendum)
If pulse runs consistently elevated Call the office   Medication Instructions:   Please increase the losartan up to 50 mg daily Monitor your blood pressure  Medication Samples have been provided to the patient.  Drug name: Eliquis       Strength: 5 mg        Qty: 2 boxes  LOT: [id number]  Exp.Date: Jun 2021   Labwork:  No new labs needed  Testing/Procedures:  No further testing at this time   Follow-Up: It was a pleasure seeing you in the office today. Please call us if you have new issues that need to be addressed before your next appt.  [phone]  Your physician wants you to follow-up in: 6 months.  You will receive a reminder letter in the mail two months in advance. If you don't receive a letter, please call our office to schedule the follow-up appointment.  If you need a refill on your cardiac medications before your next appointment, please call your pharmacy.  For educational health videos Log in to : [email or web address] Or : [email or web address], password : [email or web address]

## 2017-04-07 NOTE — Addendum Note (Signed)
Addended by: Festus AloeRESPO, Solymar Grace G on: 04/07/2017 09:04 AM   Modules accepted: Orders

## 2017-04-13 ENCOUNTER — Telehealth: Payer: Self-pay | Admitting: Cardiovascular Disease

## 2017-04-13 DIAGNOSIS — R079 Chest pain, unspecified: Secondary | ICD-10-CM | POA: Insufficient documentation

## 2017-04-13 NOTE — Telephone Encounter (Signed)
Spoke with patient and she states that since her visit she has been having chest tightness. This morning she woke up and it was just terrible. She reports that her pain has been "all the time" and has not had any relief. She is going away on vacation and would like to come in for evaluation. She just saw Dr. Mariah MillingGollan last 04/05/17 but states that she really needs to come in and be seen. Discussed her symptoms in detail and she continued to persist that she needed appointment. Scheduled her to come in tomorrow to see Dr. Mariah MillingGollan at 09:40AM. Confirmed appointment with her and she verbalized understanding.

## 2017-04-13 NOTE — Telephone Encounter (Signed)
Patient is experiencing same symptoms since visit, still with the tightness in chest Wondering if possibly allergic to blood pressure medication Having a burning sensation in chest off and on and patient does not want to wait 6 months to be seen  Please call to discuss

## 2017-04-13 NOTE — Progress Notes (Signed)
Cardiology Office Note  Date:  04/14/2017   ID:  Lynn Hernandez, DOB 03/08/1926, MRN [id number]  PCP:  Dortha Kern, MD   Chief Complaint  Patient presents with  . other    Pt. c/o a burning sensation in mid sternum. Meds reviewed by the pt. verbally.     HPI:  Ms. Bowell is a 84 year old woman with past medical history of Remote smoker, social Hypertension Insomnia Low back pain History of atrial flutter, started on anticoagulation and amiodarone October 29, 2016 [facility] of 4 Who presents for f/u of her for atrial fibrillation/flutter  Daughter presents with her today Recently seen April 05, 2017 Now reports having chest burning, pain all the time, no relief, perhaps with some Tums last night Going away on vacation and would like evaluation Feels she might be allergic to blood pressure pill Burning sensation in her chest essentially constant for the past week Reports that sometimes eating will make it go away for short period of time then seems to come back blood pressure numbers addressed today she is mildly orthostatic  Only one number this morning, standing 95/55 otherwise with standing she is typically 115-130s  Primary care is addressing anxiety and panic attacks Previously reported having chest discomfort at rest Felt amiodarone was causing chest discomfort, stopped the pill no shortness of breath or chest pain on exertion blood pressure measurements ranging from 120 up to 170 systolic  She is taking Eliquis 5 twice daily  Notes from Dr. Quillian Quince reviewed She is addressing anxiety, panic attacks  Lab work reviewed showing basic metabolic panel from June 2018 creatinine 1.04  EKG personally reviewed by myself on todays visit Shows normal sinus rhythm with rate 68 bpm rare PVC  Other past medical history reviewed  several emergency room visits 1 hospital admission September 2018 Reported that she did not feel well around September 24 Profound  weakness Daughter reports that she was not drinking, very dehydrated Was evaluated by EMTs Finally transferred to the hospital, noted to be in atrial flutter  Notes indicating left upper extremity heaviness Significant workup done in the hospital per notes MRI brain no acute changes Carotid Dopplers no significant stenosis Echo outside facility, essentially normal  CTA chest with no pulmonary embolism For atrial flutter was started On amiodarone, anticoagulation, carvedilol  EKG September 2018 showing atrial fibrillation by report EKG October 20 showing normal sinus rhythm EKG October 28 showing atrial flutter EKG November 11, 2016 showing atrial fibrillation ECG - 11/17/16 - Atrial fibrillation , ventricular rate 81 bpm. LAD EKG January 10, 2017 showing normal sinus rhythm  Stress test was low risk January 05, 2017 PFT also performed  Notes indicating she was having episodes of atrial fibrillation November 10, 2016 At that point she was restarted on amiodarone load  Worsening atrial fibrillation when she stopped the amiodarone  was restarted Was having runs up to 16 hours Last visit was only taking 100 mg daily  On last clinic visit had a fall stable for office visit, pulled on a door handle came out of the door fell backwards hit her left eye, large region of ecchymoses Did not take Eliquis last night or this morning  03/21/2017 Stopped amiodarone secondary to reported chest tightness, sore throat, dizziness    PMH:   has a past medical history of Atrial flutter (HCC), HTN (hypertension), Insomnia, and Low back pain.  PSH:    Past Surgical History:  Procedure Laterality Date  . ABDOMINAL HYSTERECTOMY    .  APPENDECTOMY    . PARTIAL COLECTOMY      Current Outpatient Medications  Medication Sig Dispense Refill  . ALPRAZolam (XANAX) 0.25 MG tablet TAKE 1 TABLET BY MOUTH TWICE A DAY AS NEEDED FOR ANXIETY  2  . apixaban (ELIQUIS) 5 MG TABS tablet Take 5 mg by mouth 2 (two)  times daily.    . Ascorbic Acid (VITAMIN C) 500 MG CHEW Chew 500 mg by mouth 2 (two) times daily.    . Glucosamine HCl 1000 MG TABS Take by mouth daily.    [name]. losartan (COZAAR) 50 MG tablet Take 1 tablet (50 mg total) by mouth daily. 90 tablet 3  . Multiple Vitamins-Minerals (MULTI-VITAMIN GUMMIES) CHEW Chew by mouth daily.     . Probiotic Product (PROBIOTIC-10 ULTIMATE) CAPS Take by mouth.    . traMADol (ULTRAM) 50 MG tablet Take 25 mg by mouth at bedtime.    . Turmeric 500 MG TABS Take by mouth daily.    [name]. zolpidem (AMBIEN) 10 MG tablet Take 5 mg by mouth at bedtime.    [name]. omeprazole (PRILOSEC) 40 MG capsule Take 1 capsule (40 mg total) by mouth 2 (two) times daily. 60 capsule 1   No current facility-administered medications for this visit.      Allergies:   Other   Social History:  The patient  reports that she has quit smoking. she has never used smokeless tobacco. She reports that she does not drink alcohol or use drugs.   Family History:   family history includes Healthy in her brother, maternal grandmother, and mother; Prostate cancer in her brother and father.    Review of Systems: Review of Systems  Constitutional: Negative.   Respiratory: Negative.   Cardiovascular: Negative.        Chest burning  Gastrointestinal: Negative.   Musculoskeletal: Negative.   Neurological: Negative.   Psychiatric/Behavioral: The patient is nervous/anxious.   All other systems reviewed and are negative.    PHYSICAL EXAM: VS:  BP (!) 162/70 (BP Location: Left Arm, Patient Position: Sitting, Cuff Size: Normal)   Pulse 64   Ht 5\' 4"  (1.626 m)   Wt 146 lb 8 oz (66.5 kg)   BMI 25.15 kg/m  , BMI Body mass index is 25.15 kg/m.  No significant change in exam compared to recent office visit Constitutional:  oriented to person, place, and time. No distress.  HENT:  Head: Normocephalic and atraumatic.  Eyes:  no discharge. No scleral icterus.  Neck: Normal range of motion. Neck supple. No JVD  present.  Cardiovascular: Normal rate, regular rhythm, normal heart sounds and intact distal pulses. Exam reveals no gallop and no friction rub. No edema No murmur heard. Pulmonary/Chest: Effort normal and breath sounds normal. No stridor. No respiratory distress.  no wheezes.  no rales.  no tenderness.  Abdominal: Soft.  no distension.  no tenderness.  Musculoskeletal: Normal range of motion.  no  tenderness or deformity.  Neurological:  normal muscle tone. Coordination normal. No atrophy Skin: Skin is warm and dry. No rash noted. not diaphoretic.  Psychiatric:  normal mood and affect. behavior is normal. Thought content normal.    Recent Labs: 11/01/2016: TSH 1.997 11/02/2016: BUN 22; Creatinine, Ser 1.36; Hemoglobin 14.3; Platelets 206; Potassium 3.8; Sodium 138    Lipid Panel No results found for: CHOL, HDL, LDLCALC, TRIG    Wt Readings from Last 3 Encounters:  04/14/17 146 lb 8 oz (66.5 kg)  04/05/17 150 lb 4 oz (68.2 kg)  03/21/17  150 lb 9 oz (68.3 kg)       ASSESSMENT AND PLAN:  Atypical atrial flutter (HCC) - Plan: EKG 12-Lead Recommend she stay on Eliquis 5 twice daily Took herself off amiodarone We will continue current medications Will need other antiarrhythmic if she has recurrent arrhythmia   Chest burning Atypical in nature Constant, not related to exertion, presenting at rest  Somewhat better with food but comes back somewhat better with Tums but comes back,  Denies any change in her diet Lots of stress and anxiety, suspect early memory problem issues  Stressed on daughter as well Recommend she try double dose Pepcid or Zantac twice daily for 4 days  Then suggest omeprazole 40 twice daily Tums as needed for breakthrough Suggested she talk with primary care concerning evaluation with GI,  Possible need for endoscopy she does report having periods of anorexia   Essential hypertension Blood pressure is well controlled on today's visit. No changes made to  the medications. Low this morning, recommend she increase her fluids Otherwise has been stable Maintaining normal sinus rhythm Encounter for anticoagulation discussion and counseling [facility]ads [facility] of 4  on Eliquis 5 twice daily  Paroxysmal atrial fibrillation (HCC) Recent normal stress test, normal echocardiogram Amiodarone previously held at her request Would use different antiarrhythmic for recurrent arrhythmia  Anxiety Has Xanax to take as needed  Disposition:   F/U  12 month  Long discussion about above, reassurance provided,  Total encounter time more than 45 minutes  Greater than 50% was spent in counseling and coordination of care with the patient    Orders Placed This Encounter  Procedures  . EKG 12-Lead     Signed, Dossie Arbour, M.D., Ph.D. 04/14/2017  [facility] [place], [place] [phone]

## 2017-04-14 ENCOUNTER — Encounter: Payer: Self-pay | Admitting: Cardiovascular Disease

## 2017-04-14 ENCOUNTER — Ambulatory Visit (INDEPENDENT_AMBULATORY_CARE_PROVIDER_SITE_OTHER): Payer: Medicare Other | Admitting: Cardiovascular Disease

## 2017-04-14 VITALS — BP 162/70 | HR 64 | Ht 64.0 in | Wt 146.5 lb

## 2017-04-14 DIAGNOSIS — F419 Anxiety disorder, unspecified: Secondary | ICD-10-CM

## 2017-04-14 DIAGNOSIS — I480 Paroxysmal atrial fibrillation: Secondary | ICD-10-CM | POA: Diagnosis not present

## 2017-04-14 DIAGNOSIS — I484 Atypical atrial flutter: Secondary | ICD-10-CM

## 2017-04-14 DIAGNOSIS — I10 Essential (primary) hypertension: Secondary | ICD-10-CM

## 2017-04-14 DIAGNOSIS — R079 Chest pain, unspecified: Secondary | ICD-10-CM

## 2017-04-14 MED ORDER — OMEPRAZOLE 40 MG PO CPDR: 40.0000 mg | DELAYED_RELEASE_CAPSULE | Freq: Two times a day (BID) | ORAL | 1 refills | Status: DC

## 2017-04-14 NOTE — Patient Instructions (Signed)
Medication Instructions:   Please start generic zantac or pepcid 2 pills twice a day for 4 days Start omeprazole one pill twice a day  Take tums for breakthrough burning  Labwork:  No new labs needed  Testing/Procedures:  No further testing at this time   Follow-Up: It was a pleasure seeing you in the office today. Please call us if you have new issues that need to be addressed before your next appt.  [phone]  Your physician wants you to follow-up in: 12 months.  You will receive a reminder letter in the mail two months in advance. If you don't receive a letter, please call our office to schedule the follow-up appointment.  If you need a refill on your cardiac medications before your next appointment, please call your pharmacy.  For educational health videos Log in to : [email or web address] Or : [email or web address], password : [email or web address]

## 2017-04-15 ENCOUNTER — Telehealth: Payer: Self-pay | Admitting: Cardiovascular Disease

## 2017-04-15 NOTE — Telephone Encounter (Signed)
Patient is having issues with afib Would like to talk to nurse about whether she should go to ER or not Please discuss

## 2017-04-15 NOTE — Telephone Encounter (Signed)
Attempted to call the patient. I left a message for the patient to call.  

## 2017-04-15 NOTE — Telephone Encounter (Signed)
I spoke with the patient. She states she is feeling better now, but was anxious earlier today when she felt her heart skipping/ like she was having occasional pauses. She inquired if this might have been a-fib or PVC's. I advised this could have been either rhythm, but with the vital signs that she reported, assured her she was within normal limits for BP & HR. I also advised her that periodic breakthrough of a-fib is common, but she is on her blood thinner already so this decreases her stroke risk. I advised her that occasional PVC's are benign. She inquired if the new acid reflux meds could have caused what she was feeling- I advised I did not think this would have been a result of the zantac.   She is scheduled for a GI evaluation next week. She was grateful for the call back.

## 2017-04-15 NOTE — Telephone Encounter (Signed)
Patient took bp 125/56 sitting  hr 60's 107/61 hr 74 standing  palpable pulse feels like it stops and starts  No sob like when she has had in previous episodes   Patient started taking xantac yesterday and wants to know if this may be related    Patient very anxious for guidance please call

## 2017-04-18 ENCOUNTER — Ambulatory Visit: Payer: Medicare Other | Admitting: Cardiovascular Disease

## 2017-04-26 ENCOUNTER — Other Ambulatory Visit: Payer: Self-pay

## 2017-04-26 ENCOUNTER — Ambulatory Visit (INDEPENDENT_AMBULATORY_CARE_PROVIDER_SITE_OTHER): Payer: Medicare Other | Admitting: Gastroenterology

## 2017-04-26 ENCOUNTER — Encounter: Payer: Self-pay | Admitting: Gastroenterology

## 2017-04-26 VITALS — BP 176/90 | HR 69 | Temp 97.7°F | Ht 64.0 in | Wt 146.0 lb

## 2017-04-26 DIAGNOSIS — R0789 Other chest pain: Secondary | ICD-10-CM | POA: Diagnosis not present

## 2017-04-26 DIAGNOSIS — R1310 Dysphagia, unspecified: Secondary | ICD-10-CM | POA: Diagnosis not present

## 2017-04-26 MED ORDER — OMEPRAZOLE 20 MG PO CPDR: 20.0000 mg | DELAYED_RELEASE_CAPSULE | Freq: Every day | ORAL | 3 refills | Status: DC

## 2017-04-26 NOTE — Addendum Note (Signed)
Addended by: Jackquline DenmarkIDGEWAY, Shaan Rhoads W on: 04/26/2017 01:54 PM   Modules accepted: Orders

## 2017-04-26 NOTE — Progress Notes (Signed)
Lynn Hernandez [address]  [address]  [place], [place] [address]  Main: [phone]  Fax: [phone]   Gastroenterology Consultation  Referring Provider:     Dortha Kern, MD Primary Care Physician:  Dortha Kern, MD Primary Gastroenterologist:  Dr. Melodie Hernandez Reason for Consultation:     Chest tightness, dysphagia        HPI:   Lynn Hernandez is a 84 y.o. y/o female referred for consultation & management  by Dr. Quillian Quince, Doreene Nest, MD.  Patient reports chest tightness in her mid chest since September 2018 when she was hospitalized for a new diagnosis of A. fib.  She feels a constant chest tightness throughout the day.  This is not related to activity or eating.  When she does eat, she does feel that the food stays in her mid chest for a while before passing.  She does not have to vomit, or cough, or bring the food back up.  She swallows water at times to allow the food to pass easily, and states that it feels that the water stays in her mid chest and then passes.  No ER visits for food impactions.  No previous symptoms prior to September 2018.  She has been evaluated by her cardiologist, Dr. Mariah Milling, and they recommended GI work-up.  Their note states that patient has had recent stress test and echo that have been negative.  They also tried giving her double dose Pepcid that she took for 4 days and it did not help.  She is currently on omeprazole 40 mg twice a day and she states this has not helped her symptoms.  Primary care and cardiology note report history of anxiety, and primary care started her on bupropion which did not help either.  Patient states she was started on amiodarone and Eliquis since September 2018.  Amiodarone has been stopped 5 to 6 weeks ago and she has not felt a difference in her symptoms.  Denies any symptoms of heartburn.  Eats 5 to 6 hours before bedtime.  Patient and daughter state that she has lost about 18 pounds since September 2018 due to  decrease in appetite.  No previous EGDs.  States she had a colonoscopy over 10 to 12 years ago by Dr. Mechele Collin, and received a letter after that saying she does not need another colonoscopy due to her age.  Past Medical History:  Diagnosis Date  . Atrial flutter (HCC)    a. Dx 10/29/2016 - [facility], [place]-->Placed on Eliquis 5 bid and amio.  [name] HTN (hypertension)   . Insomnia   . Low back pain     Past Surgical History:  Procedure Laterality Date  . ABDOMINAL HYSTERECTOMY    . APPENDECTOMY    . PARTIAL COLECTOMY      Prior to Admission medications   Medication Sig Start Date End Date Taking? Authorizing Provider  ALPRAZolam (XANAX) 0.25 MG tablet TAKE 1 TABLET BY MOUTH TWICE A DAY AS NEEDED FOR ANXIETY 03/02/17  Yes [provider]  apixaban (ELIQUIS) 5 MG TABS tablet Take 5 mg by mouth 2 (two) times daily.   Yes [provider]  Ascorbic Acid (VITAMIN C) 500 MG CHEW Chew 500 mg by mouth 2 (two) times daily.   Yes [provider]  Glucosamine HCl 1000 MG TABS Take by mouth daily.   Yes [provider]  losartan (COZAAR) 50 MG tablet Take 1 tablet (50 mg total) by mouth  daily. 04/05/17  Yes Gollan, Tollie Pizzaimothy J, MD  Multiple Vitamins-Minerals (MULTI-VITAMIN GUMMIES) CHEW Chew by mouth daily.    Yes [provider]  Probiotic Product (PROBIOTIC-10 ULTIMATE) CAPS Take by mouth.   Yes [provider]  traMADol (ULTRAM) 50 MG tablet Take 25 mg by mouth at bedtime.   Yes [provider]  Turmeric 500 MG TABS Take by mouth daily.   Yes [provider]  zolpidem (AMBIEN) 10 MG tablet Take 5 mg by mouth at bedtime.   Yes [provider]  omeprazole (PRILOSEC) 40 MG capsule Take 1 capsule (40 mg total) by mouth 2 (two) times daily. Patient not taking: Reported on 04/26/2017 04/14/17   Antonieta IbaGollan, Timothy J, MD    Family History  Problem Relation Age of Onset  . Healthy Mother        Died of old age @ 64102.  [name].  Healthy Maternal Grandmother   . Prostate cancer Father   . Prostate cancer Brother   . Healthy Brother      Social History   Tobacco Use  . Smoking status: Former Games developermoker  . Smokeless tobacco: Never Used  . Tobacco comment: smoked 1 pack/wk for about 10 years in her 750's to 2860's.  Substance Use Topics  . Alcohol use: No  . Drug use: No    Allergies as of 04/26/2017 - Review Complete 04/26/2017  Allergen Reaction Noted  . Other      Review of Systems:    All systems reviewed and negative except where noted in HPI.   Physical Exam:  BP (!) 176/90   Pulse 69   Temp 97.7 F (36.5 C)   Ht 5\' 4"  (1.626 m)   Wt 146 lb (66.2 kg)   BMI 25.06 kg/m  No LMP recorded. Patient has had a hysterectomy. Psych:  Alert and cooperative. Normal mood and affect. General:   Alert,  Well-developed, well-nourished, pleasant and cooperative in NAD Head:  Normocephalic and atraumatic. Eyes:  Sclera clear, no icterus.   Conjunctiva pink. Ears:  Normal auditory acuity. Nose:  No deformity, discharge, or lesions. Mouth:  No deformity or lesions,oropharynx pink & moist. Neck:  Supple; no masses or thyromegaly. Chest: No tenderness to palpation on mid chest Abdomen:  Normal bowel sounds.  No bruits.  Soft, non-tender and non-distended without masses, hepatosplenomegaly or hernias noted.  No guarding or rebound tenderness.    Msk:  Symmetrical without gross deformities. Good, equal movement & strength bilaterally. Pulses:  Normal pulses noted. Extremities:  No clubbing or edema.  No cyanosis. Neurologic:  Alert and oriented x3;  grossly normal neurologically. Skin:  Intact without significant lesions or rashes. No jaundice. Lymph Nodes:  No significant cervical adenopathy. Psych:  Alert and cooperative. Normal mood and affect.   Labs: CBC    Component Value Date/Time   WBC 6.8 11/02/2016 1720   RBC 4.67 11/02/2016 1720   HGB 14.3 11/02/2016 1720   HCT 42.3 11/02/2016 1720   PLT 206  11/02/2016 1720   MCV 90.5 11/02/2016 1720   MCH 30.5 11/02/2016 1720   MCHC 33.7 11/02/2016 1720   RDW 13.3 11/02/2016 1720   CMP     Component Value Date/Time   NA 138 11/02/2016 1720   K 3.8 11/02/2016 1720   CL 105 11/02/2016 1720   CO2 23 11/02/2016 1720   GLUCOSE 102 (H) 11/02/2016 1720   BUN 22 (H) 11/02/2016 1720   CREATININE 1.36 (H) 11/02/2016 1720   CALCIUM 9.4  11/02/2016 1720   GFRNONAA 34 (L) 11/02/2016 1720   GFRAA 39 (L) 11/02/2016 1720    Imaging Studies: No results found.  Assessment and Plan:   Lynn Hernandez is a 84 y.o. y/o female has been referred for chest tightness, dysphagia  Patient symptoms may be related to GERD versus esophageal spasm versus strictures or narrowing versus her anxiety Since double dose PPI have not helped her, will decrease dose to prevent adverse effects.  She will now take omeprazole 20 mg once daily 30 minutes before breakfast. Patient educated extensively on acid reflux lifestyle modification, including buying a bed wedge, not eating 3 hrs before bedtime.   Given her age, comorbidities, and requirement for anticoagulation we discussed the options of proceeding with EGD versus upper GI study.  Risks and benefits of both procedures were discussed in detail with patient and family. Upper GI study would allow for examination of her esophagus without the risks of sedation, and without the need of holding anticoagulation.  Which she was to undergo the study first.  If this study is abnormal, we discussed and then she might need an EGD for therapeutic intervention.  They verbalized understanding and would like to go this route instead of proceeding to endoscopy immediately.  She was asked to inform us of any alarm symptoms that occur, which would include inability to tolerate liquids or solids, or her secretions, increasing chest tightness, or any other reason for concern.  She was asked to go to the ER if the symptoms are severe or if she is  unable to reach [place].  She was asked to follow a dysphagia diet in the meantime, and was educated on this as well.  We will continue to follow her in clinic closely, await upper GI study, and if symptoms are persistent, can consider EGD.  Will need clearance from cardiology in regard to holding her Eliquis prior to any EGDs  Dr Lynn Hernandez

## 2017-04-26 NOTE — Patient Instructions (Addendum)
F/U 1 month [facility]30[facility]

## 2017-04-28 ENCOUNTER — Ambulatory Visit
Admission: RE | Admit: 2017-04-28 | Discharge: 2017-04-28 | Disposition: A | Discharge status: Home / Self Care | Payer: Medicare Other | Source: Ambulatory Visit | Attending: Gastroenterology | Admitting: Gastroenterology

## 2017-04-28 DIAGNOSIS — R1310 Dysphagia, unspecified: Secondary | ICD-10-CM | POA: Diagnosis present

## 2017-04-28 DIAGNOSIS — K228 Other specified diseases of esophagus: Secondary | ICD-10-CM | POA: Insufficient documentation

## 2017-04-28 DIAGNOSIS — K219 Gastro-esophageal reflux disease without esophagitis: Secondary | ICD-10-CM | POA: Insufficient documentation

## 2017-05-04 ENCOUNTER — Telehealth: Payer: Self-pay | Admitting: Gastroenterology

## 2017-05-04 NOTE — Telephone Encounter (Signed)
Sandy cook left vm to speak with debbie in regards to results

## 2017-05-05 NOTE — Telephone Encounter (Signed)
LMTCO.

## 2017-05-05 NOTE — Telephone Encounter (Signed)
Spoke with dtr this am.

## 2017-05-06 ENCOUNTER — Telehealth: Payer: Self-pay | Admitting: Cardiovascular Disease

## 2017-05-06 NOTE — Telephone Encounter (Signed)
Patients daughter called in stating that the new medication was causing her to have tremors. Reviewed that on her last visit the only change I see is where we started her on reflux medication. She reports that they thought it was the losartan causing her tremors. After further discussion about her medications she reports that she was started on Buspar along with another medication. Reviewed that the losartan she has been taking since last year and would less likely be the cause of these new symptoms. Discussed side effects of her buspar and that she may want to check with that provider. Advised that I would make Dr. Mariah MillingGollan aware and see if he had any other suggestions. She verbalized understanding of our conversation, agreement with plan, and had no further questions at this time. Let her know that if Dr. Mariah MillingGollan had any other recommendations I would give her a call back. She was appreciative for the time and information.

## 2017-05-06 NOTE — Telephone Encounter (Signed)
Patient daughter calling back about bp meds .  [place] to please call in medication to [facility] [phone]  [address] [facility] [place]  [place] [phone]

## 2017-05-06 NOTE — Telephone Encounter (Signed)
Left voicemail message to call back  

## 2017-05-06 NOTE — Telephone Encounter (Signed)
Lynn Hernandez calling for patient Would like to know if there is another blood pressure medication that can be prescribed Patient is having difficulty with side effects such as hand tremors and unsteadiness Please call to discuss

## 2017-05-11 ENCOUNTER — Telehealth: Payer: Self-pay | Admitting: Cardiovascular Disease

## 2017-05-11 ENCOUNTER — Telehealth: Payer: Self-pay

## 2017-05-11 NOTE — Telephone Encounter (Signed)
   [facility] Pre-operative Risk Assessment    Request for surgical clearance:  1. What type of surgery is being performed? EGD  2. When is this surgery scheduled? 05/17/2017  What type of clearance is required (medical clearance vs. Pharmacy clearance to hold med vs. Both)? Not listed 3. Are there any medications that need to be held prior to surgery and how long? Eliquis 5 mg  4. Practice name and name of physician performing surgery? [facility]. Dr. Chuck Hint  5. What is your office phone and fax number? [phone], [phone] fax  6. Anesthesia type (None, local, MAC, general) ? Not listed   Lucienne Minks 05/11/2017, 2:52 PM  _________________________________________________________________   (provider comments below)

## 2017-05-11 NOTE — Telephone Encounter (Signed)
Pt's daughter calls Dois Davenport(Sandra) and pt would like to proceed with EGD. She would like to have done on 05/17/17. Will need cardiology to sign medical release. Pt taking Eliquis. Will send to Dr. Dossie Arbourim Gollan (cardiologist). Also will send pre-op iostructions to pt via My Chart, daughter aware.

## 2017-05-11 NOTE — Telephone Encounter (Signed)
Fax received from  GI- " Blood thinner information request"  Patient last seen by Dr. Mariah MillingGollan on 04/14/17. On eliquis 5 mg BID for a-fib/ a-flutter.   Fax recommendations to Attn: Debbie/ Dr. Maximino Greenlandahiliani at [phone]) [phone].  To Dr. Mariah MillingGollan to review.

## 2017-05-13 NOTE — Telephone Encounter (Signed)
Acceptable risk to hold Eliquis 2 days prior to procedure Would restart anticoagulation once approved by GI Doctor home

## 2017-05-13 NOTE — Telephone Encounter (Signed)
I spoke with Dr. Jaynie Bream. Gollan's office to be sure that he got message today and they do have it. He is doing his inbox today but she will call him and let him know.

## 2017-05-13 NOTE — Telephone Encounter (Signed)
Dr. Tahiliani notified of clearance for surgery oMaximino Greenlandbtained but can only hold Eliquis x2 days. Dr. Maximino Greenlandahiliani states that based on her creatinine clearance she would need it held for 4 days. If they can't do anymore than 2 days its best to postpone till they can safely hold for 4 days. This is because we will not be able to do biopsies or dilation if needed.  Dr. Mariah MillingGollan sent this information. Left daughter Dois Davenport(Sandra) a message regarding this and she may need to contact there office if not heard anything by 4:00 or 4:30. If no response by the end of the day we will have to reschedule at a later date if appropriate.

## 2017-05-13 NOTE — Telephone Encounter (Signed)
Faxed clearance note via Epic to [facility], Dr. Norville Haggardahilani, [phone]

## 2017-05-13 NOTE — Telephone Encounter (Signed)
Pt's daughter (daughter) calls regarding clearance for EGD, taking Eliquis. We received clearance this am but Eliquis can only be held x2 days. To have Dr. Maximino Greenlandahiliani review and I will contact Dois DavenportSandra and let her know.

## 2017-05-13 NOTE — Telephone Encounter (Signed)
Dr. Mariah MillingGollan states that 4 days should be fine. Dois DavenportSandra (daughter) aware.

## 2017-05-16 ENCOUNTER — Encounter: Payer: Self-pay | Admitting: *Deleted

## 2017-05-16 ENCOUNTER — Other Ambulatory Visit: Payer: Self-pay

## 2017-05-16 DIAGNOSIS — R1310 Dysphagia, unspecified: Secondary | ICD-10-CM

## 2017-05-17 ENCOUNTER — Ambulatory Visit: Payer: Medicare Other | Admitting: Anesthesiology

## 2017-05-17 ENCOUNTER — Ambulatory Visit
Admission: RE | Admit: 2017-05-17 | Discharge: 2017-05-17 | Disposition: A | Discharge status: Home / Self Care | Payer: Medicare Other | Source: Ambulatory Visit | Attending: Gastroenterology | Admitting: Gastroenterology

## 2017-05-17 ENCOUNTER — Encounter
Admission: RE | Disposition: A | Discharge status: Home / Self Care | Payer: Self-pay | Source: Ambulatory Visit | Attending: Gastroenterology

## 2017-05-17 DIAGNOSIS — Z87891 Personal history of nicotine dependence: Secondary | ICD-10-CM | POA: Insufficient documentation

## 2017-05-17 DIAGNOSIS — R1319 Other dysphagia: Secondary | ICD-10-CM | POA: Diagnosis not present

## 2017-05-17 DIAGNOSIS — R1310 Dysphagia, unspecified: Secondary | ICD-10-CM | POA: Diagnosis present

## 2017-05-17 DIAGNOSIS — Z7901 Long term (current) use of anticoagulants: Secondary | ICD-10-CM | POA: Diagnosis not present

## 2017-05-17 DIAGNOSIS — G4700 Insomnia, unspecified: Secondary | ICD-10-CM | POA: Diagnosis not present

## 2017-05-17 DIAGNOSIS — R0789 Other chest pain: Secondary | ICD-10-CM | POA: Insufficient documentation

## 2017-05-17 DIAGNOSIS — I10 Essential (primary) hypertension: Secondary | ICD-10-CM | POA: Insufficient documentation

## 2017-05-17 DIAGNOSIS — M545 Low back pain: Secondary | ICD-10-CM | POA: Insufficient documentation

## 2017-05-17 DIAGNOSIS — I4892 Unspecified atrial flutter: Secondary | ICD-10-CM | POA: Diagnosis not present

## 2017-05-17 DIAGNOSIS — K317 Polyp of stomach and duodenum: Secondary | ICD-10-CM

## 2017-05-17 HISTORY — PX: ESOPHAGOGASTRODUODENOSCOPY (EGD) WITH PROPOFOL: SHX5813

## 2017-05-17 SURGERY — ESOPHAGOGASTRODUODENOSCOPY (EGD) WITH PROPOFOL
Anesthesia: General | Site: Mouth | Wound class: Clean Contaminated

## 2017-05-17 MED ORDER — OXYCODONE HCL 5 MG PO TABS: 5.0000 mg | ORAL_TABLET | Freq: Once | ORAL | Status: DC | PRN

## 2017-05-17 MED ORDER — OXYCODONE HCL 5 MG/5ML PO SOLN: 5.0000 mg | Freq: Once | ORAL | Status: DC | PRN

## 2017-05-17 MED ORDER — PROPOFOL 10 MG/ML IV BOLUS
INTRAVENOUS | Status: DC | PRN
  Administered 2017-05-17: 30 mg via INTRAVENOUS
  Administered 2017-05-17: 100 mg via INTRAVENOUS
  Administered 2017-05-17: 20 mg via INTRAVENOUS
  Administered 2017-05-17: 30 mg via INTRAVENOUS

## 2017-05-17 MED ORDER — STERILE WATER FOR IRRIGATION IR SOLN
Status: DC | PRN
  Administered 2017-05-17: 10:00:00

## 2017-05-17 MED ORDER — MEPERIDINE HCL 25 MG/ML IJ SOLN: 6.2500 mg | INTRAMUSCULAR | Status: DC | PRN

## 2017-05-17 MED ORDER — LIDOCAINE HCL (CARDIAC) 20 MG/ML IV SOLN
INTRAVENOUS | Status: DC | PRN
  Administered 2017-05-17: 40 mg via INTRAVENOUS

## 2017-05-17 MED ORDER — PROMETHAZINE HCL 25 MG/ML IJ SOLN: 6.2500 mg | INTRAMUSCULAR | Status: DC | PRN

## 2017-05-17 MED ORDER — LACTATED RINGERS IV SOLN
10.0000 mL/h | INTRAVENOUS | Status: DC
  Administered 2017-05-17: 10:00:00 via INTRAVENOUS

## 2017-05-17 MED ORDER — FENTANYL CITRATE (PF) 100 MCG/2ML IJ SOLN: 25.0000 ug | INTRAMUSCULAR | Status: DC | PRN

## 2017-05-17 SURGICAL SUPPLY — 7 items
BLOCK BITE 60FR ADLT L/F GRN (MISCELLANEOUS) ×3 IMPLANT
CANISTER SUCT 1200ML W/VALVE (MISCELLANEOUS) ×3 IMPLANT
FORCEPS BIOP RAD 4 LRG CAP 4 (CUTTING FORCEPS) ×3 IMPLANT
GOWN CVR UNV OPN BCK APRN NK (MISCELLANEOUS) ×2 IMPLANT
GOWN ISOL THUMB LOOP REG UNIV (MISCELLANEOUS) ×4
KIT ENDO PROCEDURE OLY (KITS) ×3 IMPLANT
WATER STERILE IRR 250ML POUR (IV SOLUTION) ×3 IMPLANT

## 2017-05-17 NOTE — Anesthesia Preprocedure Evaluation (Signed)
Anesthesia Evaluation  Patient identified by MRN, date of birth, ID band Patient awake    Reviewed: Allergy & Precautions, H&P , NPO status , Patient's Chart, lab work & pertinent test results, reviewed documented beta blocker date and time   Airway Mallampati: II  TM Distance: >3 FB Neck ROM: full    Dental no notable dental hx.    Pulmonary former smoker,    Pulmonary exam normal breath sounds clear to auscultation       Cardiovascular Exercise Tolerance: Good hypertension, + dysrhythmias Atrial Fibrillation  Rhythm:regular Rate:Normal  Cardiology note reviewed - approved off Eliquis x 4 days   Neuro/Psych Anxiety negative neurological ROS  negative psych ROS   GI/Hepatic negative GI ROS, Neg liver ROS,   Endo/Other  negative endocrine ROS  Renal/GU negative Renal ROS  negative genitourinary   Musculoskeletal   Abdominal   Peds  Hematology negative hematology ROS (+)   Anesthesia Other Findings   Reproductive/Obstetrics negative OB ROS                             Anesthesia Physical Anesthesia Plan  ASA: II  Anesthesia Plan: General   Post-op Pain Management:    Induction:   PONV Risk Score and Plan:   Airway Management Planned:   Additional Equipment:   Intra-op Plan:   Post-operative Plan:   Informed Consent: I have reviewed the patients History and Physical, chart, labs and discussed the procedure including the risks, benefits and alternatives for the proposed anesthesia with the patient or authorized representative who has indicated his/her understanding and acceptance.   Dental Advisory Given  Plan Discussed with: CRNA  Anesthesia Plan Comments:         Anesthesia Quick Evaluation

## 2017-05-17 NOTE — Discharge Instructions (Signed)
General Anesthesia, Adult, Care After °These instructions provide you with information about caring for yourself after your procedure. Your health care provider may also give you more specific instructions. Your treatment has been planned according to current medical practices, but problems sometimes occur. Call your health care provider if you have any problems or questions after your procedure. °What can I expect after the procedure? °After the procedure, it is common to have: °· Vomiting. °· A sore throat. °· Mental slowness. ° °It is common to feel: °· Nauseous. °· Cold or shivery. °· Sleepy. °· Tired. °· Sore or achy, even in parts of your body where you did not have surgery. ° °Follow these instructions at home: °For at least 24 hours after the procedure: °· Do not: °? Participate in activities where you could fall or become injured. °? Drive. °? Use heavy machinery. °? Drink alcohol. °? Take sleeping pills or medicines that cause drowsiness. °? Make important decisions or sign legal documents. °? Take care of children on your own. °· Rest. °Eating and drinking °· If you vomit, drink water, juice, or soup when you can drink without vomiting. °· Drink enough fluid to keep your urine clear or pale yellow. °· Make sure you have little or no nausea before eating solid foods. °· Follow the diet recommended by your health care provider. °General instructions °· Have a responsible adult stay with you until you are awake and alert. °· Return to your normal activities as told by your health care provider. Ask your health care provider what activities are safe for you. °· Take over-the-counter and prescription medicines only as told by your health care provider. °· If you smoke, do not smoke without supervision. °· Keep all follow-up visits as told by your health care provider. This is important. °Contact a health care provider if: °· You continue to have nausea or vomiting at home, and medicines are not helpful. °· You  cannot drink fluids or start eating again. °· You cannot urinate after 8-12 hours. °· You develop a skin rash. °· You have fever. °· You have increasing redness at the site of your procedure. °Get help right away if: °· You have difficulty breathing. °· You have chest pain. °· You have unexpected bleeding. °· You feel that you are having a life-threatening or urgent problem. °This information is not intended to replace advice given to you by your health care provider. Make sure you discuss any questions you have with your health care provider. °Document Released: 05/03/2000 Document Revised: 06/30/2015 Document Reviewed: 01/09/2015 °Elsevier Interactive Patient Education © 2018 Elsevier Inc. ° °

## 2017-05-17 NOTE — Anesthesia Procedure Notes (Signed)
Date/Time: 05/17/2017 9:47 AM Performed by: Maree KrabbeWarren, Jama Mcmiller, CRNA Pre-anesthesia Checklist: Patient identified, Emergency Drugs available, Suction available, Timeout performed and Patient being monitored Patient Re-evaluated:Patient Re-evaluated prior to induction Oxygen Delivery Method: Nasal cannula Placement Confirmation: positive ETCO2

## 2017-05-17 NOTE — Op Note (Addendum)
[facility] Gastroenterology Patient Name: Lynn Hernandez Procedure Date: 05/17/2017 9:44 AM MRN: [id number] Account #: [id number] Date of Birth: 1926-05-08 Admit Type: Outpatient Age: 84 Room: [facility] OR ROOM 01 Gender: Female Note Status: Finalized Procedure:            Upper GI endoscopy Indications:          Dysphagia, Unexplained chest pain Providers:            Aydien Majette B. Maximino Greenland MD, MD Referring MD:         Dortha Kern (Referring MD) Medicines:            Monitored Anesthesia Care Complications:        No immediate complications. Procedure:            Pre-Anesthesia Assessment:                       - The risks and benefits of the procedure and the                        sedation options and risks were discussed with the                        patient. All questions were answered and informed                        consent was obtained.                       - Patient identification and proposed procedure were                        verified prior to the procedure.                       - ASA Grade Assessment: III - A patient with severe                        systemic disease.                       After obtaining informed consent, the endoscope was                        passed under direct vision. Throughout the procedure,                        the patient's blood pressure, pulse, and oxygen                        saturations were monitored continuously. The Olympus                        GIF H180J Endoscope (S#: [id number]) was introduced                        through the mouth, and advanced to the second part of                        duodenum. The upper GI endoscopy was accomplished with  ease. The patient tolerated the procedure well. Findings:      The examined esophagus was normal. Biopsies were obtained from the       proximal and distal esophagus with cold forceps for histology of       suspected eosinophilic esophagitis.       The Z-line was regular.      There is no endoscopic evidence of esophagitis, hiatal hernia,       inflammation, mucosal abnormalities, salmon-colored mucosa, stenosis,       stricture or mass in the entire esophagus.      Multiple 2 to 3 mm sessile polyps with no bleeding and no stigmata of       recent bleeding were found in the gastric body. These were benign       appearing fundic gland polyps and biopsies were not performed as pt.       needs to restart her Eliquis today based on her Cardiologist       recommendation and the risks of bleeding from the biopsies are higher       than the benefits of biopsies of benign appearing polyps in this patient.      The examined duodenum was normal. Impression:           - Normal esophagus. Biopsied.                       - Z-line regular.                       - Multiple gastric polyps.                       - Normal examined duodenum. Recommendation:       - Await pathology results.                       - Continue present medications.                       - Return to my office in 4 weeks.                       - Further workup with manometry for esophageal spasm vs                        empiric treatment can be considered after discussion in                        clinic.                       - Return to primary care physician in 4 weeks.                       - The findings and recommendations were discussed with                        the patient.                       - The findings and recommendations were discussed with                        the patient's family. Procedure Code(s):    ---  Professional ---                       [phone], Esophagogastroduodenoscopy, flexible, transoral;                        with biopsy, single or multiple Diagnosis Code(s):    --- Professional ---                       K31.7, Polyp of stomach and duodenum                       R13.10, Dysphagia, unspecified                       R07.9, Chest pain,  unspecified CPT copyright 2017 American Medical Association. All rights reserved. The codes documented in this report are preliminary and upon coder review may  be revised to meet current compliance requirements.  Melodie Bouillon, MD Michel Bickers B. Maximino Greenland MD, MD 05/17/2017 10:13:30 AM This report has been signed electronically. Number of Addenda: 0 Note Initiated On: 05/17/2017 9:44 AM Estimated Blood Loss: Estimated blood loss: none.      [facility]

## 2017-05-17 NOTE — Anesthesia Postprocedure Evaluation (Signed)
Anesthesia Post Note  Patient: Lynn Hernandez  Procedure(s) Performed: ESOPHAGOGASTRODUODENOSCOPY (EGD) WITH PROPOFOL (N/A Mouth)  Patient location during evaluation: PACU Anesthesia Type: General Level of consciousness: awake and alert Pain management: pain level controlled Vital Signs Assessment: post-procedure vital signs reviewed and stable Respiratory status: spontaneous breathing, nonlabored ventilation, respiratory function stable and patient connected to nasal cannula oxygen Cardiovascular status: blood pressure returned to baseline and stable Postop Assessment: no apparent nausea or vomiting Anesthetic complications: no    Kyeshia Zinn ELAINE

## 2017-05-17 NOTE — Transfer of Care (Signed)
Immediate Anesthesia Transfer of Care Note  Patient: Lynn SaleBetty B Ziolkowski  Procedure(s) Performed: ESOPHAGOGASTRODUODENOSCOPY (EGD) WITH PROPOFOL (N/A )  Patient Location: PACU  Anesthesia Type: General  Level of Consciousness: awake, alert  and patient cooperative  Airway and Oxygen Therapy: Patient Spontanous Breathing and Patient connected to supplemental oxygen  Post-op Assessment: Post-op Vital signs reviewed, Patient's Cardiovascular Status Stable, Respiratory Function Stable, Patent Airway and No signs of Nausea or vomiting  Post-op Vital Signs: Reviewed and stable  Complications: No apparent anesthesia complications

## 2017-05-17 NOTE — H&P (Signed)
Lynn BouillonVarnita Hernandez, Lynn Hernandez [address], [address], [place], [place], [address] [address], [address], [place], [place], [address] Phone: [phone]  Fax: [phone]  Primary Care Physician:  Lynn Hernandez, Lynn Hernandez, Lynn Hernandez   Pre-Procedure History & Physical: HPI:  Lynn Hernandez is a 84 y.o. female is here for an EGD.   Past Medical History:  Diagnosis Date  . Atrial flutter (HCC)    a. [facility] [place] [place]Placed on Eliquis 5 bid and amio.  [name]. HTN (hypertension)   . Insomnia   . Low back pain     Past Surgical History:  Procedure Laterality Date  . ABDOMINAL HYSTERECTOMY    . APPENDECTOMY    . PARTIAL COLECTOMY      Prior to Admission medications   Medication Sig Start Date End Date Taking? Authorizing Provider  ALPRAZolam (XANAX) 0.25 MG tablet TAKE 1 TABLET BY MOUTH TWICE A DAY AS NEEDED FOR ANXIETY 03/02/17  Yes Provider, Historical, Lynn Hernandez  apixaban (ELIQUIS) 5 MG TABS tablet Take 5 mg by mouth 2 (two) times daily.   Yes Provider, Historical, Lynn Hernandez  Ascorbic Acid (VITAMIN C) 500 MG CHEW Chew 500 mg by mouth 2 (two) times daily.   Yes Provider, Historical, Lynn Hernandez  Glucosamine HCl 1000 MG TABS Take by mouth daily.   Yes Provider, Historical, Lynn Hernandez  losartan (COZAAR) 50 MG tablet Take 1 tablet (50 mg total) by mouth daily. 04/05/17  Yes Gollan, Tollie Pizzaimothy J, Lynn Hernandez  Multiple Vitamins-Minerals (MULTI-VITAMIN GUMMIES) CHEW Chew by mouth daily.    Yes Provider, Historical, Lynn Hernandez  omeprazole (PRILOSEC) 20 MG capsule Take 1 capsule (20 mg total) by mouth daily. 04/26/17  Yes Lynn Hernandez, Lynn Hernandez, Lynn Hernandez  Probiotic Product (PROBIOTIC-10 ULTIMATE) CAPS Take by mouth.   Yes Provider, Historical, Lynn Hernandez  traMADol (ULTRAM) 50 MG tablet Take 25 mg by mouth at bedtime.   Yes Provider, Historical, Lynn Hernandez  Turmeric 500 MG TABS Take by mouth daily.   Yes Provider, Historical, Lynn Hernandez  zolpidem (AMBIEN) 10 MG tablet Take 5 mg by mouth at bedtime.   Yes Provider, Historical, Lynn Hernandez    Allergies as of 05/16/2017 - Review  Complete 05/16/2017  Allergen Reaction Noted  . Buspar [buspirone]  05/16/2017  . Other      Family History  Problem Relation Age of Onset  . Healthy Mother        Died of old age @ 75102.  [name]. Healthy Maternal Grandmother   . Prostate cancer Father   . Prostate cancer Brother   . Healthy Brother     Social History   Socioeconomic History  . Marital status: Married    Spouse name: Not on file  . Number of children: Not on file  . Years of education: Not on file  . Highest education level: Not on file  Occupational History  . 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 Financial resource strain: Not on file  . Food insecurity:    Worry: Not on file    Inability: Not on file  . Transportation needs:    Medical: Not on file    Non-medical: Not on file  Tobacco Use  . Smoking status: Former Smoker    Types: Cigarettes    Last attempt to quit: 1990    Years since quitting: 29.2  . Smokeless tobacco: Never Used  . Tobacco comment: smoked 1 pack/wk for about 10 years in her 3850's to 6260's.  Substance and Sexual Activity  . Alcohol use: No  . Drug use: No  . Sexual activity: Not on file  Lifestyle  . Physical activity:    Days per week: Not on file    Minutes per session: Not on file  . Stress: Not on file  Relationships  . Social connections:    Talks on phone: Not on file    Gets together: Not on file    Attends religious service: Not on file    Active member of club or organization: Not on file    Attends meetings of clubs or organizations: Not on file    Relationship status: Not on file  . Intimate partner violence:    Fear of current or ex partner: Not on file    Emotionally abused: Not on file    Physically abused: Not on file    Forced sexual activity: Not on file  Other Topics Concern  . Not on file  Social History Narrative   Lives in [place].  Active.  Does not routinely exercise.    Review of Systems: See HPI, otherwise  negative ROS  Physical Exam: BP (!) 176/80   Pulse 71   Temp 97.7 F (36.5 C) (Temporal)   Wt 139 lb (63 kg)   SpO2 98%   BMI 23.86 kg/m  General:   Alert,  pleasant and cooperative in NAD Head:  Normocephalic and atraumatic. Neck:  Supple; no masses or thyromegaly. Lungs:  Clear throughout to auscultation, normal respiratory effort.    Heart:  +S1, +S2, Regular rate and rhythm, No edema. Abdomen:  Soft, nontender and nondistended. Normal bowel sounds, without guarding, and without rebound.   Neurologic:  Alert and  oriented x4;  grossly normal neurologically.  Impression/Plan: Lynn Hernandez is here for an EGD for dysphagia, chest tightness. Last took Eliquis on Thursday night, 05/12/17.   Risks, benefits, limitations, and alternatives regarding the procedure have been reviewed with the patient.  Questions have been answered.  All parties agreeable.   Lynn Hernandez, Lynn Hernandez  05/17/2017, 9:34 AM

## 2017-05-18 ENCOUNTER — Encounter: Payer: Self-pay | Admitting: Gastroenterology

## 2017-05-20 ENCOUNTER — Encounter: Payer: Self-pay | Admitting: Gastroenterology

## 2017-05-26 ENCOUNTER — Encounter: Payer: Self-pay | Admitting: Gastroenterology

## 2017-05-27 ENCOUNTER — Telehealth: Payer: Self-pay | Admitting: Cardiovascular Disease

## 2017-05-27 NOTE — Telephone Encounter (Signed)
Left voicemail message to call back  

## 2017-05-27 NOTE — Telephone Encounter (Signed)
Patients daughter reports that she has gone through xanax, zoloft, lexapro, clonazepam, and has not been resting. Daughter reports that she has rested and they have done videos to show her this. She states that they have done everything that was recommended and that during last visit Dr. Mariah MillingGollan mentioned heart cath would be the only remaining test and that she has had everything else which were normal. Daughter reports that she may persist about her symptoms until we do a heart cath due to her severe anxiety. Reviewed options for her mother with her current complaints of going to ED for evaluation. She verbalized understanding with no further questions at this time.

## 2017-05-27 NOTE — Telephone Encounter (Signed)
Pt daughter Nelva Bushorma stating patient is having more so a panic attack   She would like to see if we can please call her back for there is nothing wrong with patient. PCP is closed today. She is taking all her Panic attack medications. She states they know it is her Anxiety. But patient is really needing someone to just call her back But daughter states patient is completely fine, if they would have seen her with any issues they would have already called 911   Please advise

## 2017-05-27 NOTE — Telephone Encounter (Signed)
Spoke with Dois DavenportSandra patients daughter and also with patient. She states that they are concerned about some of her symptoms and her PCP is not open today. They report chest burning and discomfort. Reviewed medications that Dr. Mariah MillingGollan discussed at last visit. She requests that I please call other sister because she is the one who typically manages her care. Confirmed phone number and will reach out to her as well.

## 2017-05-27 NOTE — Telephone Encounter (Signed)
Cardiac cath can be arranged Anticoagulation would need to be held for 2 days prior On CT scan there is moderate aortic atherosclerosis and coronary calcifications noted, Unable to exclude unstable angina

## 2017-05-27 NOTE — Telephone Encounter (Signed)
No answer/voicemail box is full.  

## 2017-05-31 ENCOUNTER — Encounter: Payer: Self-pay | Admitting: Gastroenterology

## 2017-05-31 ENCOUNTER — Telehealth: Payer: Self-pay | Admitting: Gastroenterology

## 2017-05-31 NOTE — Telephone Encounter (Signed)
Family was unable to see pathology results although the lab was released.   Scanned the results and emailed.

## 2017-05-31 NOTE — Telephone Encounter (Signed)
Lynn Hernandez called back stating she can not read the attachment for pt results it is blurry could you resend it please

## 2017-05-31 NOTE — Telephone Encounter (Signed)
Spoke with patients daughter and reviewed that I had spoke with Dr. Mariah MillingGollan regarding her concerns. She is still going to monitor her mother and will let us know if further testing is needed. She was very appreciative for the call with no further questions at this time.

## 2017-05-31 NOTE — Telephone Encounter (Signed)
Pt daughter Nelva Bushorma is calling to get test results released to MyChart from Procedure or a phone call with explanation of the rersults

## 2017-05-31 NOTE — Telephone Encounter (Signed)
No answer/Voicemail box is full.  

## 2017-06-08 ENCOUNTER — Telehealth: Payer: Self-pay | Admitting: Gastroenterology

## 2017-06-08 NOTE — Telephone Encounter (Signed)
Pt is returning a call  

## 2017-06-09 NOTE — Telephone Encounter (Signed)
I spoke with pt. See result note.  

## 2017-06-22 ENCOUNTER — Ambulatory Visit: Payer: Medicare Other | Admitting: Gastroenterology

## 2017-07-29 ENCOUNTER — Ambulatory Visit: Payer: Medicare Other | Admitting: Cardiovascular Disease

## 2018-03-02 IMAGING — CT CT ANGIO CHEST
2 of 6 series · 18 of 46 positions shown · IV contrast (APPLIED)
Comparison: Chest radiograph 11/01/2016

CLINICAL DATA: Chest pain and weakness

EXAM:
CT ANGIOGRAPHY CHEST WITH CONTRAST
TECHNIQUE: Multidetector CT imaging of the chest was performed using the
standard protocol during bolus administration of intravenous
contrast. Multiplanar CT image reconstructions and MIPs were
obtained to evaluate the vascular anatomy.
CONTRAST:  60 mL Isovue 370

[Series 5: thins · axial · 0.62mm/px · z∈[-474,-235]mm · 16 of 263 slices shown]
[im 12/263  lung]
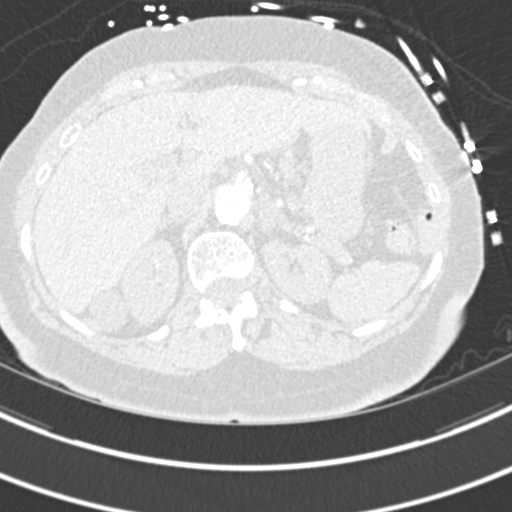
[im 35/263  soft-tissue]
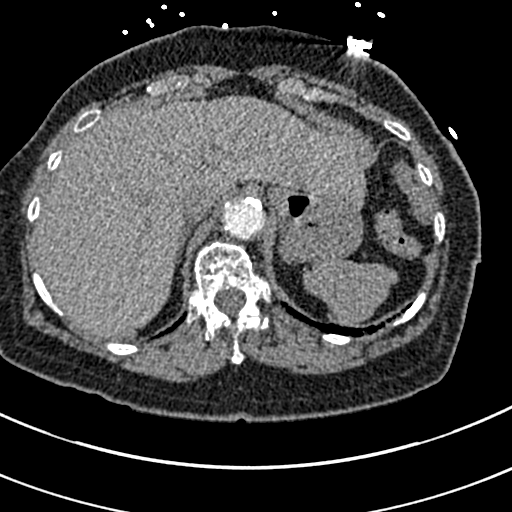
[im 46/263  lung]
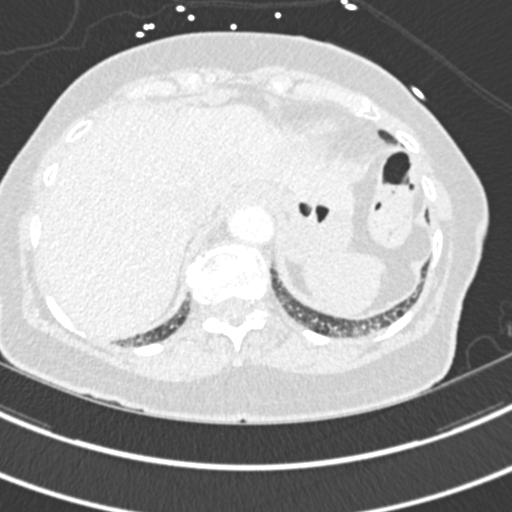
[im 57/263  soft-tissue]
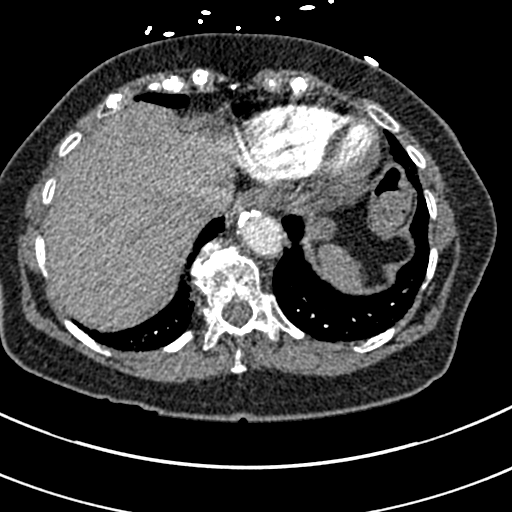
[im 80/263  lung]
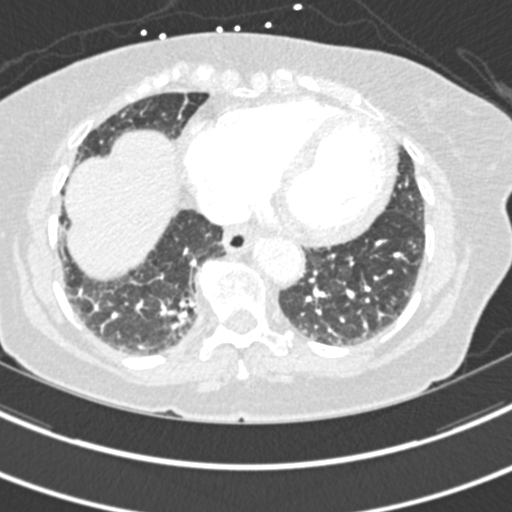
[im 92/263  soft-tissue]
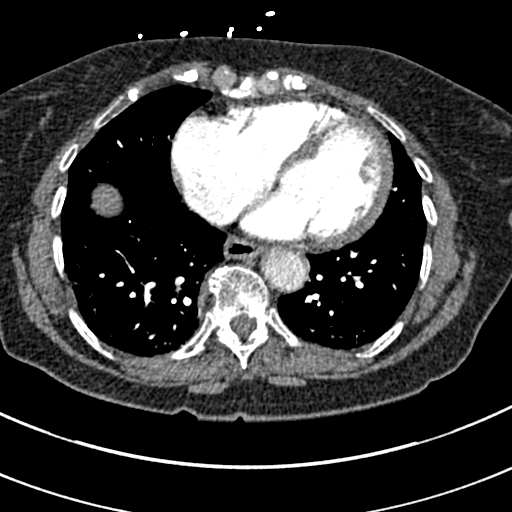
[im 103/263  lung]
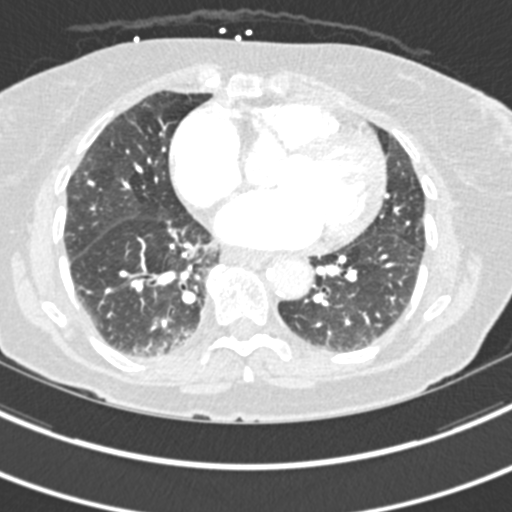
[im 126/263  soft-tissue]
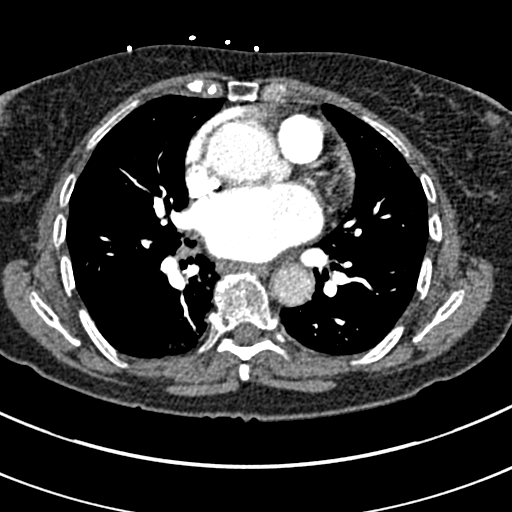
[im 137/263  lung]
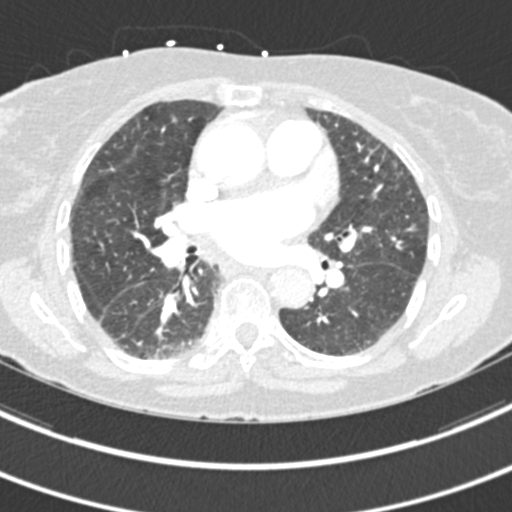
[im 160/263  soft-tissue]
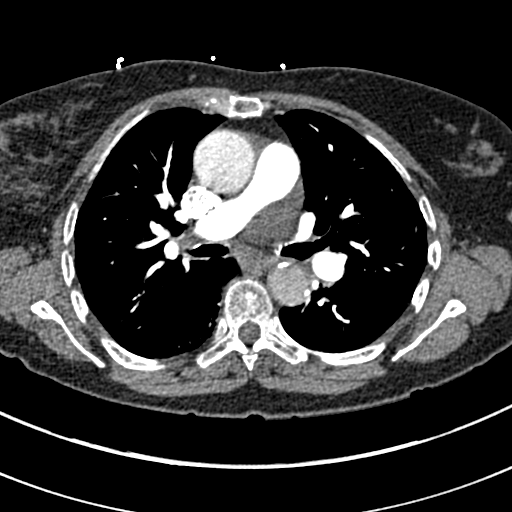
[im 171/263  lung]
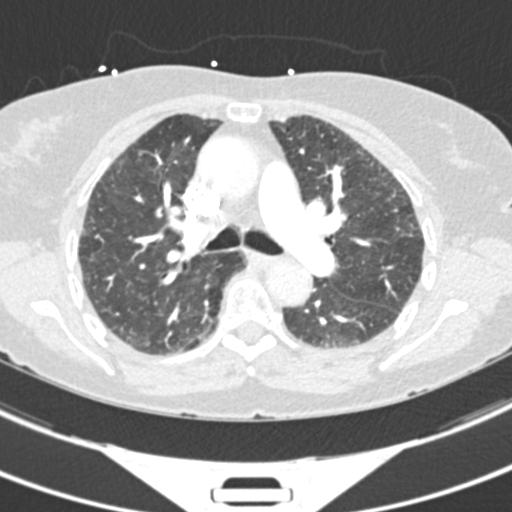
[im 183/263  soft-tissue]
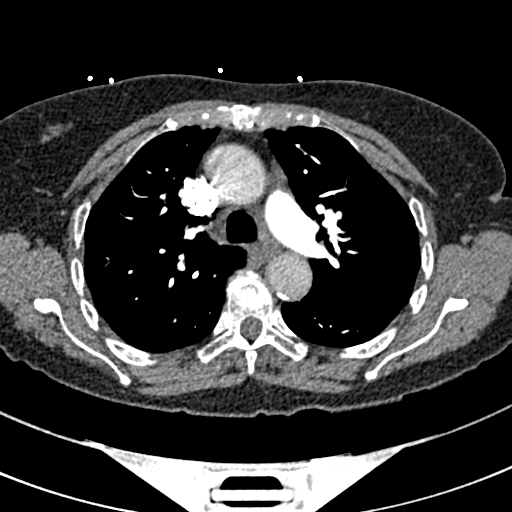
[im 206/263  lung]
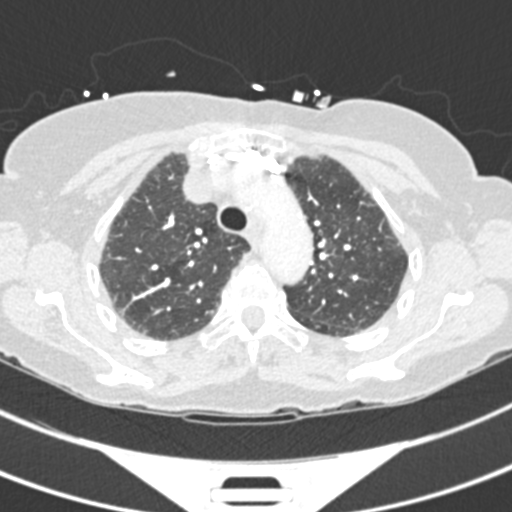
[im 217/263  soft-tissue]
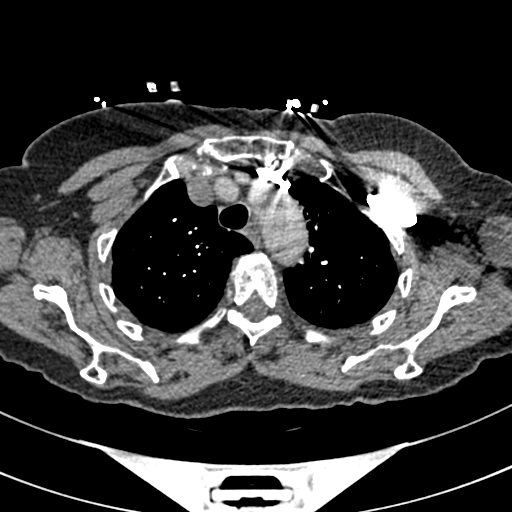
[im 228/263  lung]
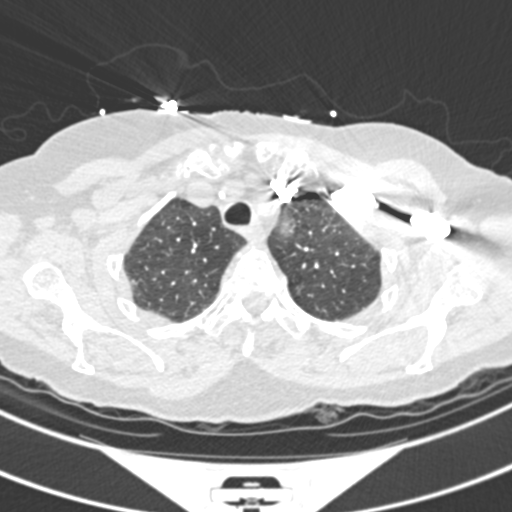
[im 251/263  soft-tissue]
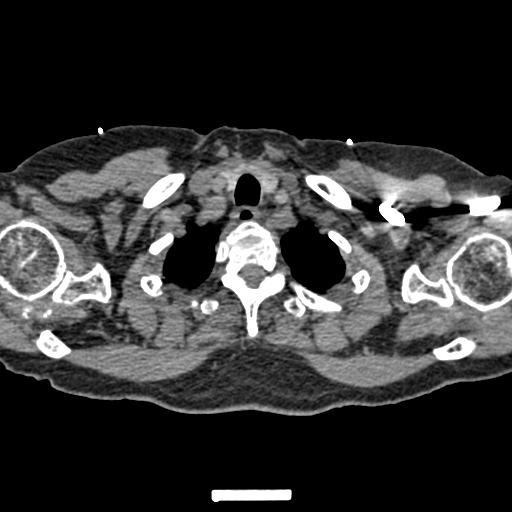

[Series 7: coronal mpr · coronal · 0.52mm/px · 2 of 79 slices shown]
[im 27/79  soft-tissue]
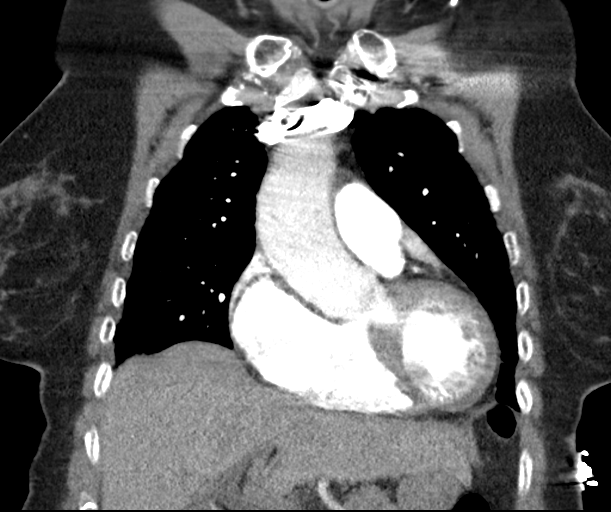
[im 53/79  soft-tissue]
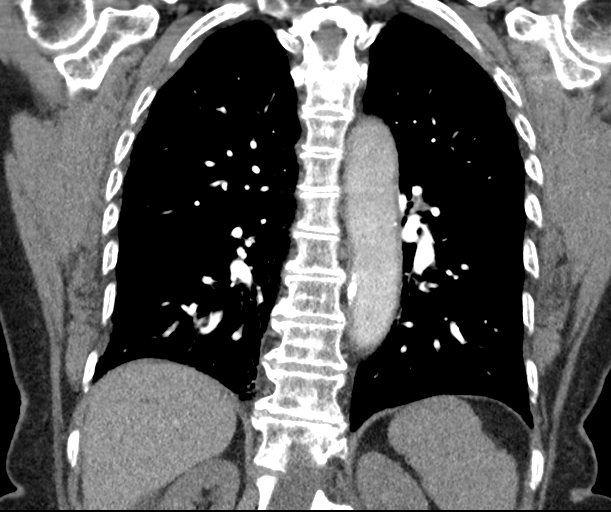

[18 of 46 positions shown; findings below may reference images not displayed]

FINDINGS: Cardiovascular: Contrast injection is sufficient to demonstrate
satisfactory opacification of the pulmonary arteries to the
segmental level. There is no pulmonary embolus. The main pulmonary
artery is within normal limits for size. There is no CT evidence of
acute right heart strain. There is calcific aortic atherosclerosis.
There is a normal 3-vessel arch branching pattern. Heart size is
mildly enlarged. No pericardial effusion.

Mediastinum/Nodes: No mediastinal, hilar or axillary
lymphadenopathy. The visualized thyroid and thoracic esophageal
course are unremarkable.

Lungs/Pleura: 4 mm nodule in the right upper lobe. No pleural
effusion or pneumothorax. Bibasilar atelectasis. No focal
consolidation. There are multiple perifissural nodules of the left
lung measuring up to 3 mm. Focal area of atelectasis in the left
lower lobe.

Upper Abdomen: Contrast bolus timing is not optimized for evaluation
of the abdominal organs. Within this limitation, the visualized
organs of the upper abdomen are normal.

Musculoskeletal: No chest wall abnormality. No acute or significant
osseous findings.

Review of the MIP images confirms the above findings.
IMPRESSION: 1. No pulmonary embolus or other acute thoracic abnormality.
2. 4 mm right upper lobe nodule. 3 mm left upper lobe nodules. No
follow-up needed if patient is low-risk (and has no known or
suspected primary neoplasm). Non-contrast chest CT can be considered
in 12 months if patient is high-risk. This recommendation follows
the consensus statement: Guidelines for Management of Incidental
Pulmonary Nodules Detected on CT Images: From the [HOSPITAL]
3. Mild cardiomegaly and Aortic Atherosclerosis ([id number]-YGL.L).

## 2018-03-03 IMAGING — CR DG CHEST 2V
2 series · 2 of 2 positions shown · non-contrast
Comparison: Chest CT 11/01/2016 and CXR 11/01/2016

CLINICAL DATA: Atrial fibrillation and flutter.

EXAM:
CHEST  2 VIEW

[chest pa]
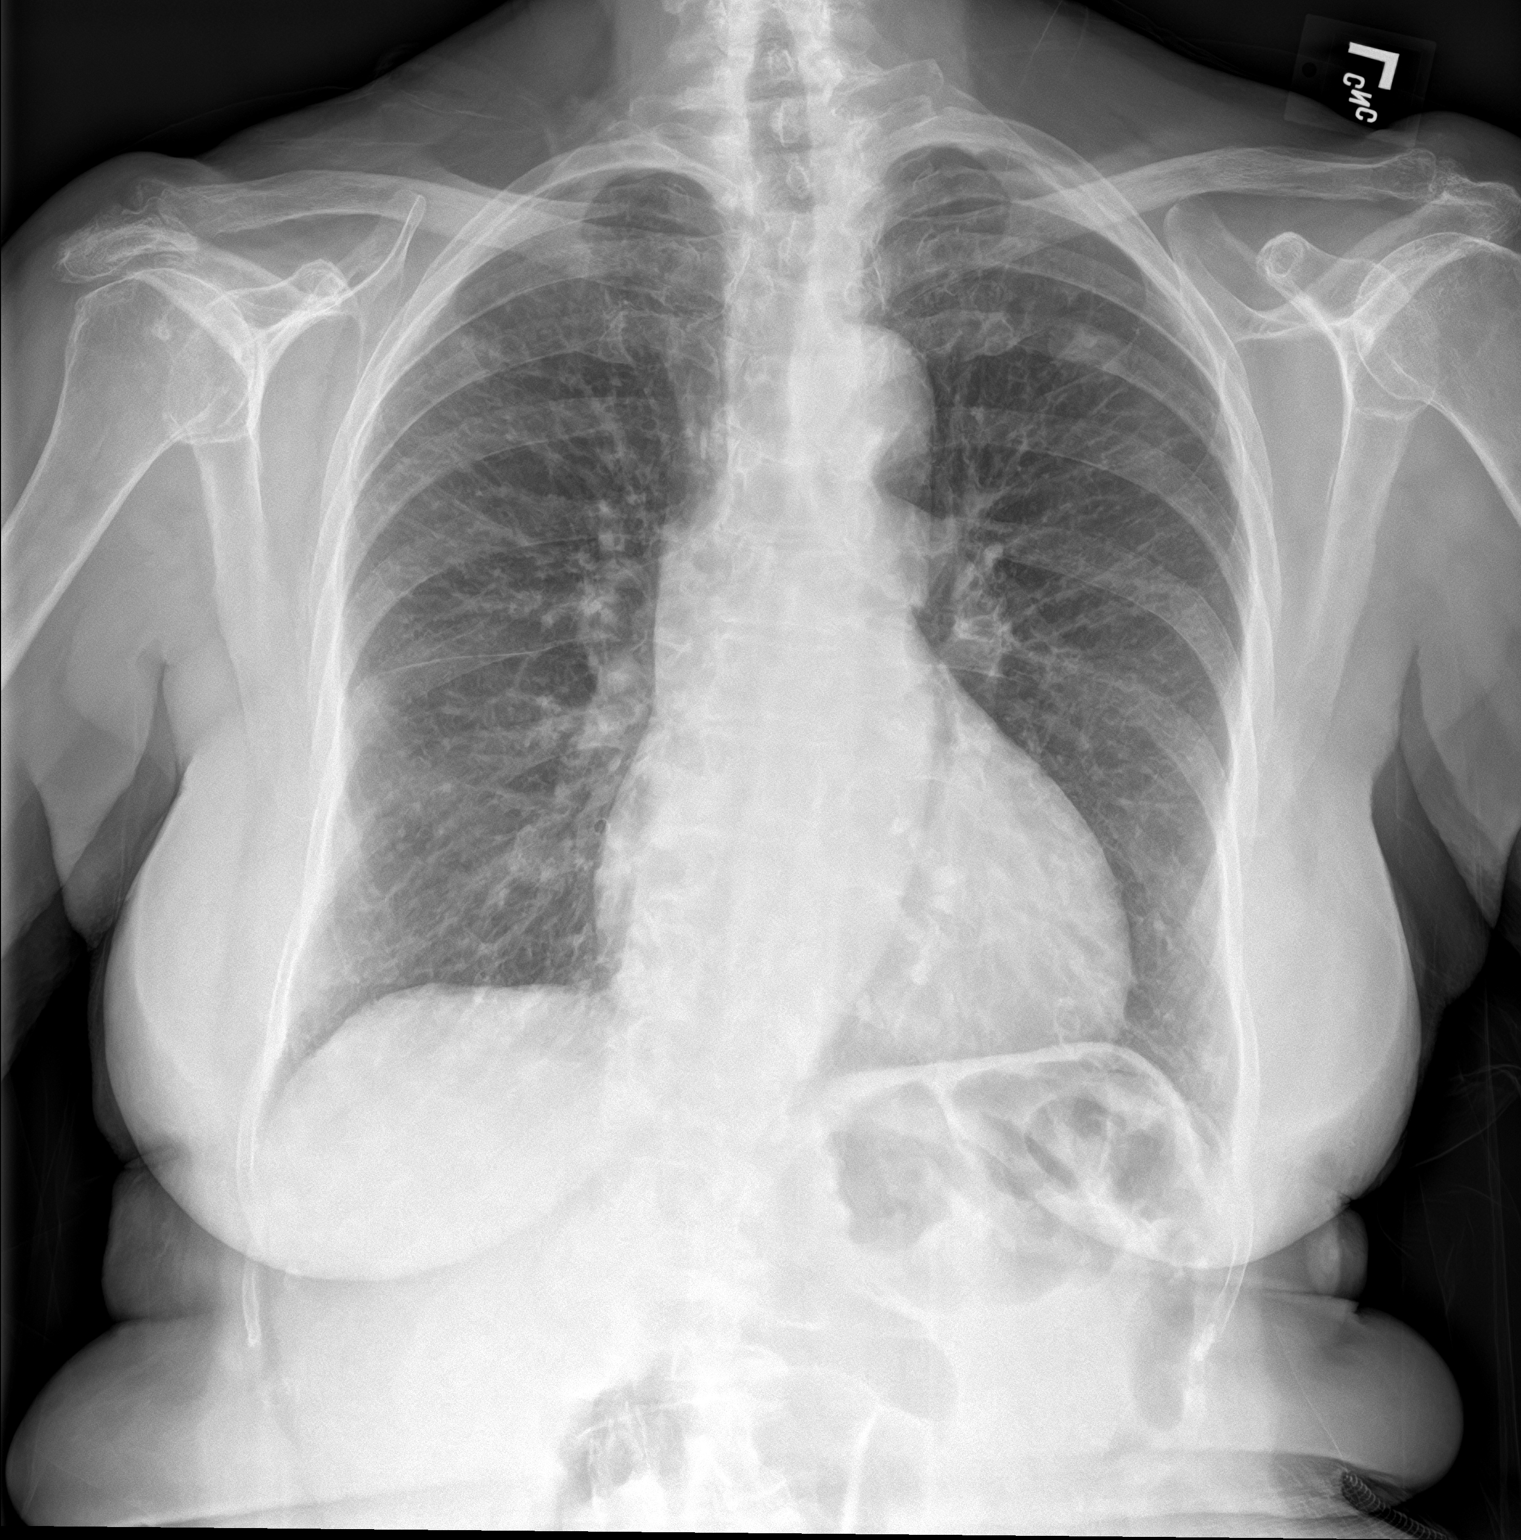

[chest lat]
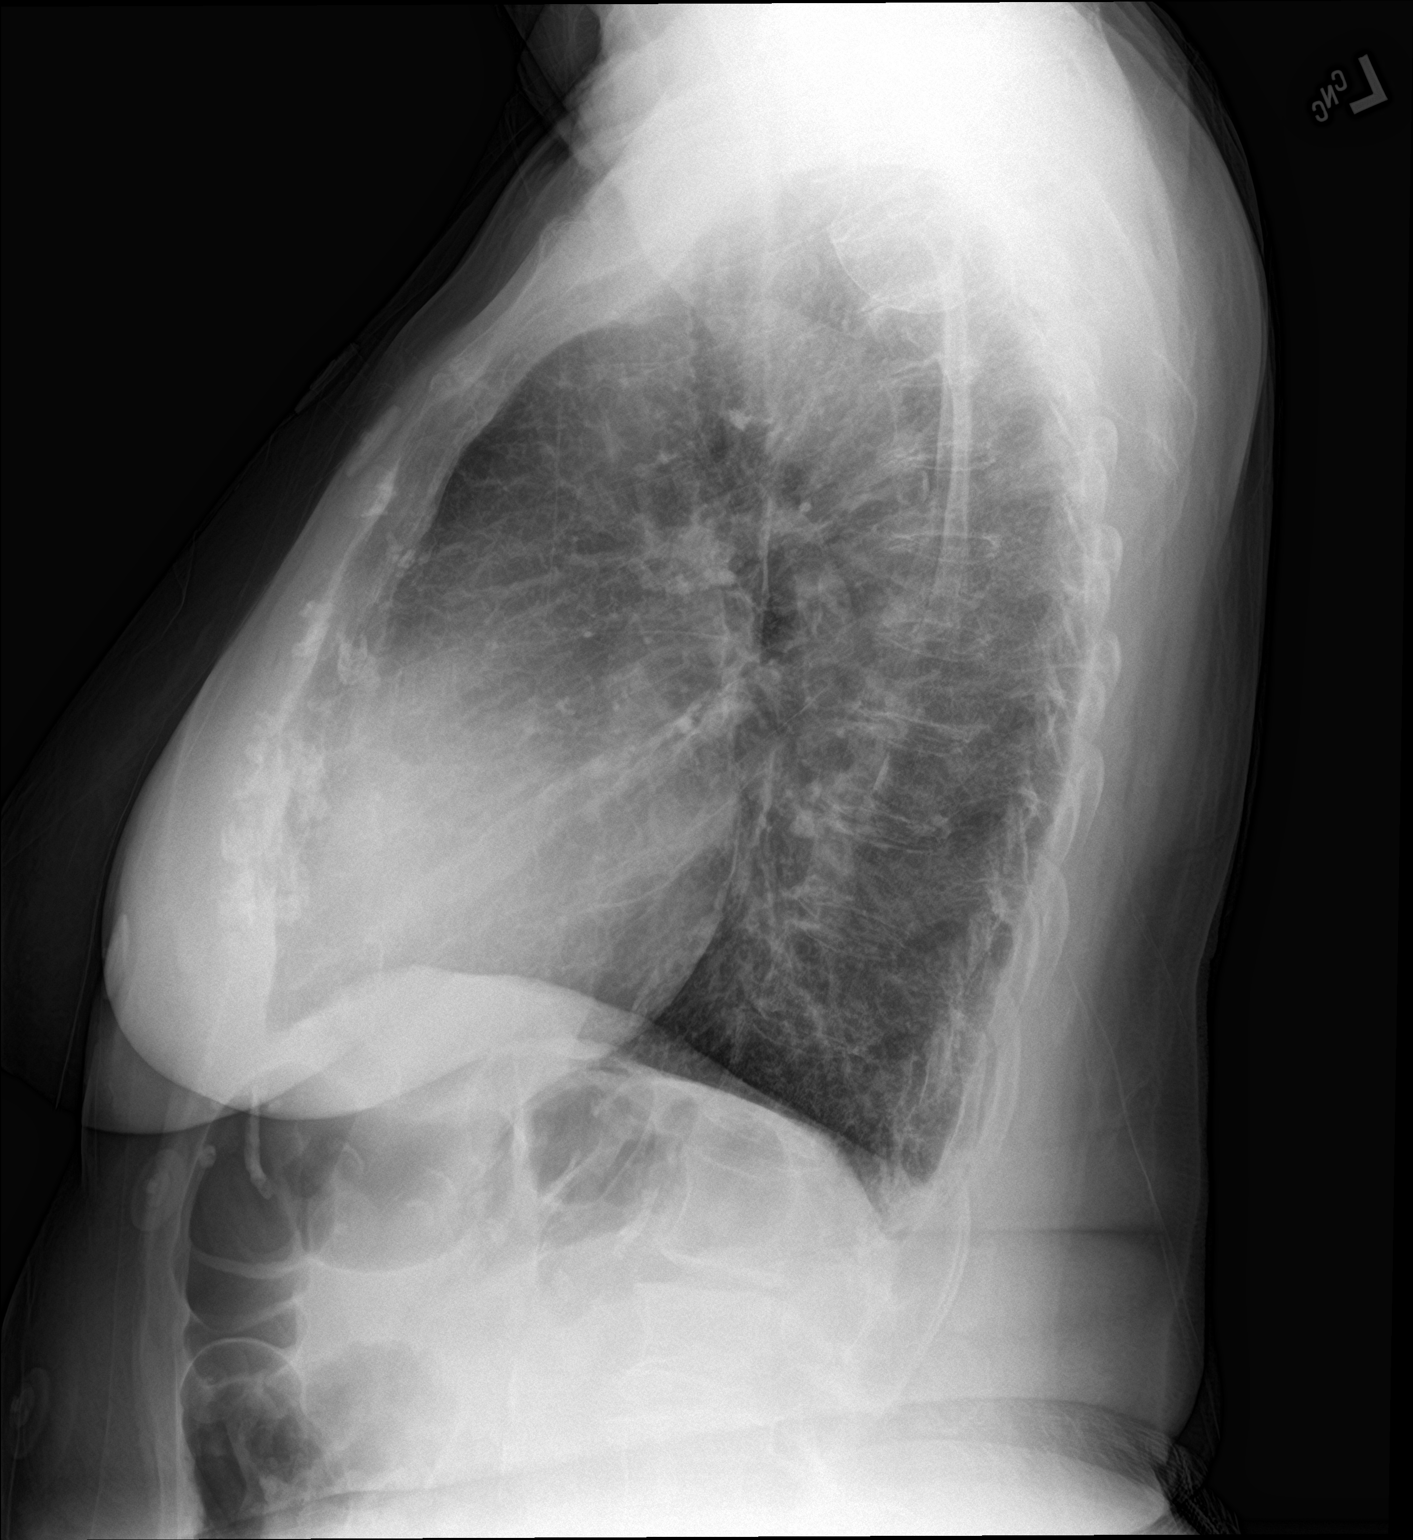

[2 of 2 positions shown; findings below may reference images not displayed]

FINDINGS: Stable cardiomegaly with mild uncoiling of the thoracic aorta.
Emphysematous hyperinflation of the lungs upper lobe predominant. No
pneumonic consolidation, effusion nor overt pulmonary edema. Aortic
atherosclerosis at the arch. Bone island of the right humeral head.
Osteoarthritis of the AC and glenohumeral joints bilaterally.
Thoracic spondylosis.
IMPRESSION: Stable cardiomegaly with aortic atherosclerosis.  COPD.

## 2019-03-15 ENCOUNTER — Telehealth: Payer: Self-pay | Admitting: Cardiovascular Disease

## 2019-03-15 NOTE — Telephone Encounter (Signed)
Patient has switched care to [facility] Deleted recall

## 2020-07-19 ENCOUNTER — Ambulatory Visit
Admission: RE | Admit: 2020-07-19 | Discharge: 2020-07-19 | Disposition: A | Discharge status: Home / Self Care | Payer: Medicare Other | Source: Ambulatory Visit | Attending: Physician Assistant | Admitting: Physician Assistant

## 2020-07-19 ENCOUNTER — Other Ambulatory Visit: Payer: Self-pay

## 2020-07-19 DIAGNOSIS — Z111 Encounter for screening for respiratory tuberculosis: Secondary | ICD-10-CM | POA: Diagnosis not present

## 2020-07-19 MED ORDER — TUBERCULIN PPD 5 UNIT/0.1ML ID SOLN
5.0000 [IU] | Freq: Once | INTRADERMAL | Status: DC
  Administered 2020-07-19: 5 [IU] via INTRADERMAL

## 2020-07-19 NOTE — ED Triage Notes (Signed)
Pt presents to [facility] for PPD placement.

## 2020-07-19 NOTE — Discharge Instructions (Signed)
Follow up in 48-72 hours for reading.

## 2020-07-21 ENCOUNTER — Other Ambulatory Visit: Payer: Self-pay

## 2020-07-21 ENCOUNTER — Ambulatory Visit
Admission: RE | Admit: 2020-07-21 | Discharge: 2020-07-21 | Disposition: A | Discharge status: Home / Self Care | Payer: Medicare Other | Source: Ambulatory Visit | Attending: Family Medicine | Admitting: Family Medicine

## 2020-07-21 VITALS — BP 121/55 | HR 54 | Temp 98.3°F | Resp 16

## 2020-07-21 DIAGNOSIS — Z111 Encounter for screening for respiratory tuberculosis: Secondary | ICD-10-CM | POA: Diagnosis not present

## 2020-07-21 NOTE — ED Triage Notes (Signed)
Pt presents today requesting PPD to be read. Area to right forearm negative. Form signed.

## 2020-07-21 NOTE — ED Notes (Signed)
PPD test to right forearm = negative.

## 2020-07-21 NOTE — Discharge Instructions (Signed)
PPD test reading negative. 07/21/2020

## 2020-10-12 ENCOUNTER — Ambulatory Visit
Admission: EM | Admit: 2020-10-12 | Discharge: 2020-10-12 | Disposition: A | Discharge status: Home / Self Care | Payer: Medicare Other | Attending: Internal Medicine | Admitting: Internal Medicine

## 2020-10-12 ENCOUNTER — Other Ambulatory Visit: Payer: Self-pay

## 2020-10-12 DIAGNOSIS — I10 Essential (primary) hypertension: Secondary | ICD-10-CM | POA: Diagnosis not present

## 2020-10-12 DIAGNOSIS — R808 Other proteinuria: Secondary | ICD-10-CM | POA: Diagnosis not present

## 2020-10-12 DIAGNOSIS — R079 Chest pain, unspecified: Secondary | ICD-10-CM | POA: Insufficient documentation

## 2020-10-12 DIAGNOSIS — I484 Atypical atrial flutter: Secondary | ICD-10-CM | POA: Insufficient documentation

## 2020-10-12 DIAGNOSIS — U071 COVID-19: Secondary | ICD-10-CM | POA: Diagnosis not present

## 2020-10-12 DIAGNOSIS — R112 Nausea with vomiting, unspecified: Secondary | ICD-10-CM | POA: Insufficient documentation

## 2020-10-12 DIAGNOSIS — R9431 Abnormal electrocardiogram [ECG] [EKG]: Secondary | ICD-10-CM | POA: Insufficient documentation

## 2020-10-12 DIAGNOSIS — E860 Dehydration: Secondary | ICD-10-CM | POA: Diagnosis not present

## 2020-10-12 LAB — POCT URINALYSIS DIP (DEVICE)
Bilirubin Urine: NEGATIVE
Glucose, UA: NEGATIVE mg/dL
Ketones, ur: NEGATIVE mg/dL
Leukocytes,Ua: NEGATIVE
Nitrite: NEGATIVE
Protein, ur: 100 mg/dL — AB
Specific Gravity, Urine: 1.02 (ref 1.005–1.030)
Urobilinogen, UA: 0.2 mg/dL (ref 0.0–1.0)
pH: 7 (ref 5.0–8.0)

## 2020-10-12 LAB — POC SARS CORONAVIRUS 2 AG: SARSCOV2ONAVIRUS 2 AG: NEGATIVE

## 2020-10-12 MED ORDER — ONDANSETRON 4 MG PO TBDP
4.0000 mg | ORAL_TABLET | Freq: Once | ORAL | Status: AC
  Administered 2020-10-12: 4 mg via ORAL

## 2020-10-12 MED ORDER — ONDANSETRON HCL 4 MG/2ML IJ SOLN
4.0000 mg | Freq: Once | INTRAMUSCULAR | Status: AC
  Administered 2020-10-12: 4 mg via INTRAMUSCULAR

## 2020-10-12 NOTE — ED Notes (Signed)
Patient is being discharged from the Urgent Care and sent to the Emergency Department via POV . Per Garey Ham, patient is in need of higher level of care due to vomiting and no better with anti nausea medications. Patient is aware and verbalizes understanding of plan of care.  Vitals:   10/12/20 0821  BP: 140/86  Pulse: 60  Resp: 18  Temp: 98.5 F (36.9 C)  SpO2: 98%

## 2020-10-12 NOTE — ED Triage Notes (Signed)
Pt here with daughter with C/O Vomiting for 3-4 days. No pain or other SX. Able to keep some fluids down, has not eat since Friday. Has taken Prevacid. Daughter states that it started as indigestion after having a ECHO done from them pushing on her.

## 2020-10-12 NOTE — Discharge Instructions (Addendum)
Please go to the ER right now with your daughter to have more test done since your are not responding to what we tried here. Your urine shows +2 protein but no infection

## 2020-10-12 NOTE — ED Provider Notes (Signed)
[facility]    CSN: [id number] Arrival date & time: 10/12/20  0804      History   Chief Complaint Chief Complaint  Patient presents with   Emesis    HPI Lynn Hernandez is a 87 y.o. female who presents with her daughter due to developing epigastric pain 3 days ago after having an echocardiogram. She stated the technitian pressed hard on this area. Since then she has vomited 3-4 times per day. Yesterday was able to hold down Pedialyte fine, and wanted to try ginger ale, and this provoked vomiting. This am 10 minutes after taking her meds she vomited the water she took. Has not had an appetite. Denies fatigue. Has not eaten anything since this started. Has hx of atrial flutter and had an ablation.  Her last BM was 4 days ago.     Past Medical History:  Diagnosis Date   Atrial flutter (HCC)    a. Dx 10/29/2016 - [facility], [place]-->Placed on Eliquis 5 bid and amio.   HTN (hypertension)    Insomnia    Low back pain     Patient Active Problem List   Diagnosis Date Noted   Gastric polyp    Other dysphagia    Chest pain 04/13/2017   Paroxysmal atrial fibrillation (HCC) 03/21/2017   Anxiety 03/21/2017   Falls 03/21/2017   Atypical atrial flutter (HCC) 03/17/2017   Essential hypertension 03/17/2017   Encounter for anticoagulation discussion and counseling 03/17/2017   Dyspnea 11/01/2016    Past Surgical History:  Procedure Laterality Date   ABDOMINAL HYSTERECTOMY     APPENDECTOMY     ESOPHAGOGASTRODUODENOSCOPY (EGD) WITH PROPOFOL N/A 05/17/2017   Procedure: ESOPHAGOGASTRODUODENOSCOPY (EGD) WITH PROPOFOL;  Surgeon: Pasty Spillers, MD;  Location: [facility];  Service: Endoscopy;  Laterality: N/A;   PARTIAL COLECTOMY      OB History   No obstetric history on file.      Home Medications    Prior to Admission medications   Medication Sig Start Date End Date Taking? Authorizing Provider  lansoprazole (PREVACID) 30 MG capsule Take 30  mg by mouth daily. 10/02/20  Yes [provider]  losartan (COZAAR) 50 MG tablet Take 1 tablet (50 mg total) by mouth daily. 04/05/17  Yes Antonieta Iba, MD  traZODone (DESYREL) 50 MG tablet Take by mouth. 09/22/20 10/22/20 Yes [provider]  XARELTO 15 MG TABS tablet Take 15 mg by mouth daily. 10/09/20  Yes [provider]    Family History Family History  Problem Relation Age of Onset   Healthy Mother        Died of old age @ 42.   Healthy Maternal Grandmother    Prostate cancer Father    Prostate cancer Brother    Healthy Brother     Social History Social History   Tobacco Use   Smoking status: Former    Types: Cigarettes    Quit date: 1990    Years since quitting: 32.6   Smokeless tobacco: Never   Tobacco comments:    smoked 1 pack/wk for about 10 years in her 24's to 51's.  Vaping Use   Vaping Use: Never used  Substance Use Topics   Alcohol use: No   Drug use: No     Allergies   Buspar [buspirone] and Other   Review of Systems Review of Systems  Constitutional:  Positive for appetite change and chills. Negative for diaphoresis, fatigue and fever.  HENT:  Negative for congestion, ear pain, facial swelling, rhinorrhea and sore throat.   Eyes:  Negative for discharge.  Respiratory:  Positive for chest tightness. Negative for cough, shortness of breath and wheezing.   Cardiovascular:  Negative for chest pain and leg swelling.  Gastrointestinal:  Positive for abdominal pain, nausea and vomiting. Negative for blood in stool, constipation and diarrhea.  Genitourinary:  Negative for dysuria, frequency and urgency.  Musculoskeletal:  Negative for gait problem and myalgias.       Has chronic back pain  Skin:  Negative for rash.  Neurological:  Negative for headaches.  Hematological:  Negative for adenopathy.    Physical Exam Triage Vital Signs ED Triage Vitals  Enc Vitals Group     BP 10/12/20 0821 140/86     Pulse Rate 10/12/20 0821  60     Resp 10/12/20 0821 18     Temp 10/12/20 0821 98.5 F (36.9 C)     Temp Source 10/12/20 0821 Oral     SpO2 10/12/20 0821 98 %     Weight 10/12/20 0815 134 lb (60.8 kg)     Height 10/12/20 0815 5' 4.5" (1.638 m)     Head Circumference --      Peak Flow --      Pain Score 10/12/20 0815 0     Pain Loc --      Pain Edu? --      Excl. in GC? --    Orthostatic VS for the past 24 hrs:  BP- Lying Pulse- Lying BP- Sitting Pulse- Sitting BP- Standing at 0 minutes Pulse- Standing at 0 minutes  10/12/20 0853 (!) 170/106 102 (!) 168/107 95 137/85 117    Updated Vital Signs BP 140/86 (BP Location: Right Arm)   Pulse 60   Temp 98.5 F (36.9 C) (Oral)   Resp 18   Ht 5' 4.5" (1.638 m)   Wt 134 lb (60.8 kg)   SpO2 98%   BMI 22.65 kg/m   Visual Acuity Right Eye Distance:   Left Eye Distance:   Bilateral Distance:    Right Eye Near:   Left Eye Near:    Bilateral Near:     Physical Exam Constitutional:      Appearance: She is ill-appearing. She is not toxic-appearing or diaphoretic.     Comments: She is thin  HENT:     Head: Normocephalic.     Right Ear: External ear normal.     Left Ear: External ear normal.     Nose: Nose normal.  Eyes:     General: No scleral icterus.    Conjunctiva/sclera: Conjunctivae normal.  Cardiovascular:     Rate and Rhythm: Tachycardia present.     Comments:  RIR, Rate is 106 Pulmonary:     Effort: Pulmonary effort is normal.     Breath sounds: Normal breath sounds.  Abdominal:     General: Bowel sounds are normal.     Palpations: Abdomen is soft.     Tenderness: There is abdominal tenderness.     Comments: In epigastric area  Musculoskeletal:        General: Normal range of motion.     Cervical back: Neck supple.     Right lower leg: No edema.     Left lower leg: No edema.  Skin:    General: Skin is warm and dry.     Findings: No rash.  Neurological:     Mental Status: She is alert and oriented to person,  place, and time.      Gait: Gait normal.  Psychiatric:        Mood and Affect: Mood normal.        Behavior: Behavior normal.        Thought Content: Thought content normal.        Judgment: Judgment normal.     UC Treatments / Results  Labs (all labs ordered are listed, but only abnormal results are displayed) Labs Reviewed  POCT URINALYSIS DIP (DEVICE) - Abnormal; Notable for the following components:      Result Value   Hgb urine dipstick SMALL (*)    Protein, ur 100 (*)    All other components within normal limits  SARS CORONAVIRUS 2 (TAT 6-24 HRS)  POCT URINALYSIS DIPSTICK, ED / UC  POC SARS CORONAVIRUS 2 AG    EKG  Sinus tachy, but unable to compare with her last EKG from [facility] from March 2022 due to no image available. Dr Leonides Grills was able to review the written report from March and he does not see any changes for exception of tachy today.   Radiology No results found.  Procedures Procedures (including critical care time)  Medications Ordered in UC Medications  ondansetron (ZOFRAN) injection 4 mg (4 mg Intramuscular Given 10/12/20 0907)  ondansetron (ZOFRAN-ODT) disintegrating tablet 4 mg (4 mg Oral Given 10/12/20 0958)    Initial Impression / Assessment and Plan / UC Course  I have reviewed the triage vital signs and the nursing notes. Pertinent labs  results that were available during my care of the patient were reviewed by me and considered in my medical decision making (see chart for details). She was given Zofran 4 mg IM and 30 minutes it minimally helped and could only take 3 sips of water, so was given another 4 mg ODT zofran and 30 minutes later she could not tell it helped at all and was dry heaving when I walked in the room. .  I sent pt to ER for further work up and may need IV fluids.    Final Clinical Impressions(s) / UC Diagnoses   Final diagnoses:  Nonspecific abnormal electrocardiogram (ECG) (EKG)  Intractable vomiting with nausea, unspecified vomiting type  Chest pain,  unspecified type  Dehydration     Discharge Instructions      Please go to the ER right now with your daughter to have more test done since your are not responding to what we tried here. Your urine shows +2 protein but no infection     ED Prescriptions   None    PDMP not reviewed this encounter.   Garey Ham, PA-C 10/12/20 1507

## 2020-10-13 LAB — SARS CORONAVIRUS 2 (TAT 6-24 HRS): SARS Coronavirus 2: POSITIVE — AB
